# Patient Record
Sex: Male | Born: 1970 | Race: White | Hispanic: No | Marital: Single | Smoking: Current some day smoker
Health system: Southern US, Community
[De-identification: ages and names within clinical notes are randomized; demographics above are authoritative.]

## PROBLEM LIST (undated history)

## (undated) DIAGNOSIS — I10 Essential (primary) hypertension: Secondary | ICD-10-CM

## (undated) DIAGNOSIS — E119 Type 2 diabetes mellitus without complications: Secondary | ICD-10-CM

## (undated) DIAGNOSIS — E785 Hyperlipidemia, unspecified: Secondary | ICD-10-CM

## (undated) DIAGNOSIS — K5792 Diverticulitis of intestine, part unspecified, without perforation or abscess without bleeding: Secondary | ICD-10-CM

## (undated) HISTORY — DX: Type 2 diabetes mellitus without complications: E11.9

## (undated) HISTORY — DX: Essential (primary) hypertension: I10

## (undated) HISTORY — DX: Hyperlipidemia, unspecified: E78.5

---

## 2012-05-11 ENCOUNTER — Ambulatory Visit: Payer: Self-pay

## 2015-08-06 ENCOUNTER — Encounter: Payer: Self-pay | Admitting: Emergency Medicine

## 2015-08-06 ENCOUNTER — Emergency Department
Admission: EM | Admit: 2015-08-06 | Discharge: 2015-08-06 | Disposition: A | Discharge status: Home / Self Care | Payer: BLUE CROSS/BLUE SHIELD | Attending: Emergency Medicine | Admitting: Emergency Medicine

## 2015-08-06 DIAGNOSIS — W228XXA Striking against or struck by other objects, initial encounter: Secondary | ICD-10-CM | POA: Diagnosis not present

## 2015-08-06 DIAGNOSIS — Y929 Unspecified place or not applicable: Secondary | ICD-10-CM | POA: Insufficient documentation

## 2015-08-06 DIAGNOSIS — Y999 Unspecified external cause status: Secondary | ICD-10-CM | POA: Diagnosis not present

## 2015-08-06 DIAGNOSIS — F17210 Nicotine dependence, cigarettes, uncomplicated: Secondary | ICD-10-CM | POA: Insufficient documentation

## 2015-08-06 DIAGNOSIS — S01111A Laceration without foreign body of right eyelid and periocular area, initial encounter: Secondary | ICD-10-CM | POA: Insufficient documentation

## 2015-08-06 DIAGNOSIS — Y9389 Activity, other specified: Secondary | ICD-10-CM | POA: Insufficient documentation

## 2015-08-06 DIAGNOSIS — S0181XA Laceration without foreign body of other part of head, initial encounter: Secondary | ICD-10-CM

## 2015-08-06 NOTE — ED Triage Notes (Signed)
Patient presents to the ED with laceration to the right side of his forehead.  Patient states he was doing work on his truck and accidentally hit himself in the head with a ratchet.  Patient is in no obvious distress at this time.  Patient denies losing consciousness.  Patient alert and oriented x 4 at this time and ambulatory to triage.

## 2015-08-06 NOTE — ED Notes (Signed)
See triage note. No LOC, pain 8/10. Pt alert & oriented. NAD noted.

## 2015-08-06 NOTE — ED Provider Notes (Signed)
[facility] Emergency Department Provider Note   ____________________________________________  Time seen: Approximately 6:58 PM  I have reviewed the triage vital signs and the nursing notes.   HISTORY  Chief Complaint Laceration    HPI Jeremiah Neal is a 45 y.o. male patient complaining of right eyebrow laceration secondary to blunt trauma.Self-inflicted wound secondary to a ratchet admitting the face. Patient denies any vision loss. Patient denies any vertigo or headache. He was controlled direct pressure. No other palliative measures for his complaint. Patient rates pain as a 3/10. Patient describes pain as a dull ache.  History reviewed. No pertinent past medical history.  There are no active problems to display for this patient.   History reviewed. No pertinent surgical history.    Allergies Penicillins  No family history on file.  Social History Social History  Substance Use Topics  . Smoking status: Current Every Day Smoker    Packs/day: 1.00    Types: Cigarettes  . Smokeless tobacco: Never Used  . Alcohol use Yes     Comment: occasionally    Review of Systems Constitutional: No fever/chills Eyes: No visual changes. ENT: No sore throat. Cardiovascular: Denies chest pain. Respiratory: Denies shortness of breath. Gastrointestinal: No abdominal pain.  No nausea, no vomiting.  No diarrhea.  No constipation. Genitourinary: Negative for dysuria. Musculoskeletal: Negative for back pain. Skin: Negative for rash.Right eyebrow laceration Neurological: Negative for headaches, focal weakness or numbness.    ____________________________________________   PHYSICAL EXAM:  VITAL SIGNS: ED Triage Vitals  Enc Vitals Group     BP 08/06/15 1836 (!) 171/109     Pulse Rate 08/06/15 1836 96     Resp 08/06/15 1836 20     Temp 08/06/15 1836 98.1 F (36.7 C)     Temp src --      SpO2 08/06/15 1836 96 %     Weight 08/06/15 1832 210 lb  (95.3 kg)     Height 08/06/15 1832  (1.778 m)     Head Circumference --      Peak Flow --      Pain Score 08/06/15 1833 8     Pain Loc --      Pain Edu? --      Excl. in GC? --     Constitutional: Alert and oriented. Well appearing and in no acute distress. Eyes: Conjunctivae are normal. PERRL. EOMI. Head: Atraumatic. Nose: No congestion/rhinnorhea. Mouth/Throat: Mucous membranes are moist.  Oropharynx non-erythematous. Neck: No stridor. No cervical spine tenderness to palpation. Hematological/Lymphatic/Immunilogical: No cervical lymphadenopathy. Cardiovascular: Normal rate, regular rhythm. Grossly normal heart sounds.  Good peripheral circulation.Elevated blood pressure. Will retake before discharge. Respiratory: Normal respiratory effort.  No retractions. Lungs CTAB. Gastrointestinal: Soft and nontender. No distention. No abdominal bruits. No CVA tenderness. Musculoskeletal: No lower extremity tenderness nor edema.  No joint effusions. Neurologic:  Normal speech and language. No gross focal neurologic deficits are appreciated. No gait instability. Skin:  Skin is warm, dry and intact. No rash noted. Psychiatric: Mood and affect are normal. Speech and behavior are normal.  ____________________________________________   LABS (all labs ordered are listed, but only abnormal results are displayed)  Labs Reviewed - No data to display ____________________________________________  EKG   ____________________________________________  RADIOLOGY  ____________________________________________   PROCEDURES  Procedure(s) performed: None  Procedures  Critical Care performed: No  ____________________________________________   INITIAL IMPRESSION / ASSESSMENT AND PLAN / ED COURSE  Pertinent labs & imaging results that were available during  my care of the patient were reviewed by me and considered in my medical decision making (see chart for details).  LACERATION  REPAIR Performed by: Joni Reining Authorized by: Joni Reining Consent: Verbal consent obtained. Risks and benefits: risks, benefits and alternatives were discussed Consent given by: patient Patient identity confirmed: provided demographic data Prepped and Draped in normal sterile fashion Wound explored  Laceration Location: Right eyebrow  Laceration Length: 1 cm  No Foreign Bodies seen or palpated Irrigation method: syringe Amount of cleaning: standard Skin closure: Dermabond  Patient tolerance: Patient tolerated the procedure well with no immediate complications.   Clinical Course     ____________________________________________   FINAL CLINICAL IMPRESSION(S) / ED DIAGNOSES Facial laceration Patient given discharge care instructions. Advised to return if wound reopens. Discharge blood pressure was 156/97. Final diagnoses:  Facial laceration, initial encounter      NEW MEDICATIONS STARTED DURING THIS VISIT:  New Prescriptions   No medications on file     Note:  This document was prepared using Dragon voice recognition software and may include unintentional dictation errors.    Joni Reining, PA-C 08/06/15 1906    Joni Reining, PA-C 08/06/15 1910    Joni Reining, PA-C 08/06/15 1916    Rockne Menghini, MD 08/07/15 [phone]

## 2017-06-24 ENCOUNTER — Emergency Department
Admission: EM | Admit: 2017-06-24 | Discharge: 2017-06-24 | Disposition: A | Discharge status: Home / Self Care | Payer: BLUE CROSS/BLUE SHIELD | Attending: Emergency Medicine | Admitting: Emergency Medicine

## 2017-06-24 ENCOUNTER — Encounter: Payer: Self-pay | Admitting: Emergency Medicine

## 2017-06-24 ENCOUNTER — Emergency Department: Payer: BLUE CROSS/BLUE SHIELD

## 2017-06-24 DIAGNOSIS — K5792 Diverticulitis of intestine, part unspecified, without perforation or abscess without bleeding: Secondary | ICD-10-CM

## 2017-06-24 DIAGNOSIS — N433 Hydrocele, unspecified: Secondary | ICD-10-CM | POA: Diagnosis not present

## 2017-06-24 DIAGNOSIS — F17210 Nicotine dependence, cigarettes, uncomplicated: Secondary | ICD-10-CM | POA: Diagnosis not present

## 2017-06-24 DIAGNOSIS — R109 Unspecified abdominal pain: Secondary | ICD-10-CM | POA: Diagnosis present

## 2017-06-24 DIAGNOSIS — K5732 Diverticulitis of large intestine without perforation or abscess without bleeding: Secondary | ICD-10-CM | POA: Diagnosis not present

## 2017-06-24 LAB — CBC
HCT: 44.9 % (ref 40.0–52.0)
HEMOGLOBIN: 15.6 g/dL (ref 13.0–18.0)
MCH: 30.1 pg (ref 26.0–34.0)
MCHC: 34.7 g/dL (ref 32.0–36.0)
MCV: 86.8 fL (ref 80.0–100.0)
Platelets: 304 10*3/uL (ref 150–440)
RBC: 5.17 MIL/uL (ref 4.40–5.90)
RDW: 13.3 % (ref 11.5–14.5)
WBC: 11.7 10*3/uL — ABNORMAL HIGH (ref 3.8–10.6)

## 2017-06-24 LAB — COMPREHENSIVE METABOLIC PANEL
ALBUMIN: 4.1 g/dL (ref 3.5–5.0)
ALT: 22 U/L (ref 17–63)
ANION GAP: 9 (ref 5–15)
AST: 19 U/L (ref 15–41)
Alkaline Phosphatase: 74 U/L (ref 38–126)
BUN: 13 mg/dL (ref 6–20)
CHLORIDE: 105 mmol/L (ref 101–111)
CO2: 25 mmol/L (ref 22–32)
Calcium: 9.1 mg/dL (ref 8.9–10.3)
Creatinine, Ser: 1.21 mg/dL (ref 0.61–1.24)
GFR calc Af Amer: >60 mL/min (ref >60)
GFR calc non Af Amer: >60 mL/min (ref >60)
GLUCOSE: 123 mg/dL — AB (ref 65–99)
Potassium: 3.9 mmol/L (ref 3.5–5.1)
SODIUM: 139 mmol/L (ref 135–145)
TOTAL PROTEIN: 7.6 g/dL (ref 6.5–8.1)
Total Bilirubin: 0.6 mg/dL (ref 0.3–1.2)

## 2017-06-24 LAB — URINALYSIS, COMPLETE (UACMP) WITH MICROSCOPIC
BILIRUBIN URINE: NEGATIVE
Bacteria, UA: NONE SEEN
Glucose, UA: NEGATIVE mg/dL
HGB URINE DIPSTICK: NEGATIVE
Ketones, ur: NEGATIVE mg/dL
Leukocytes, UA: NEGATIVE
NITRITE: NEGATIVE
PROTEIN: NEGATIVE mg/dL
Specific Gravity, Urine: 1.033 — ABNORMAL HIGH (ref 1.005–1.030)
Squamous Epithelial / LPF: NONE SEEN (ref 0–5)
pH: 6 (ref 5.0–8.0)

## 2017-06-24 LAB — LIPASE, BLOOD: LIPASE: 23 U/L (ref 11–51)

## 2017-06-24 MED ORDER — CIPROFLOXACIN HCL 500 MG PO TABS
500.0000 mg | ORAL_TABLET | Freq: Once | ORAL | Status: AC
  Administered 2017-06-24: 500 mg via ORAL
  Filled 2017-06-24: qty 1

## 2017-06-24 MED ORDER — SODIUM CHLORIDE 0.9 % IV BOLUS
1000.0000 mL | Freq: Once | INTRAVENOUS | Status: AC
  Administered 2017-06-24: 1000 mL via INTRAVENOUS

## 2017-06-24 MED ORDER — ONDANSETRON HCL 4 MG/2ML IJ SOLN
4.0000 mg | Freq: Once | INTRAMUSCULAR | Status: AC
  Administered 2017-06-24: 4 mg via INTRAVENOUS
  Filled 2017-06-24: qty 2

## 2017-06-24 MED ORDER — MORPHINE SULFATE (PF) 4 MG/ML IV SOLN
4.0000 mg | Freq: Once | INTRAVENOUS | Status: AC
  Administered 2017-06-24: 4 mg via INTRAVENOUS
  Filled 2017-06-24: qty 1

## 2017-06-24 MED ORDER — CIPROFLOXACIN HCL 500 MG PO TABS: 500.0000 mg | ORAL_TABLET | Freq: Two times a day (BID) | ORAL | 0 refills | Status: AC

## 2017-06-24 MED ORDER — METRONIDAZOLE 500 MG PO TABS
500.0000 mg | ORAL_TABLET | Freq: Once | ORAL | Status: AC
  Administered 2017-06-24: 500 mg via ORAL
  Filled 2017-06-24: qty 1

## 2017-06-24 MED ORDER — IOHEXOL 300 MG/ML  SOLN
100.0000 mL | Freq: Once | INTRAMUSCULAR | Status: AC | PRN
  Administered 2017-06-24: 100 mL via INTRAVENOUS
  Filled 2017-06-24: qty 100

## 2017-06-24 MED ORDER — METRONIDAZOLE 500 MG PO TABS: 500.0000 mg | ORAL_TABLET | Freq: Three times a day (TID) | ORAL | 0 refills | Status: AC

## 2017-06-24 MED ORDER — TRAMADOL HCL 50 MG PO TABS: 50.0000 mg | ORAL_TABLET | Freq: Four times a day (QID) | ORAL | 0 refills | Status: DC | PRN

## 2017-06-24 NOTE — ED Provider Notes (Signed)
[facility] Emergency Department Provider Note ____________________________________________   First MD Initiated Contact with Patient 06/24/17 1504     (approximate)  I have reviewed the triage vital signs and the nursing notes.  HISTORY  Chief Complaint Abdominal Pain  HPI Jeremiah Jeremiah is a 47 y.o. male with a history of an inguinal hernia who is presenting to the emergency department with 1 week of lower abdominal pain.  He says that he was moving a heavy piece of equipment about a week ago and then aching started the day after.  However, he is now having episodes of severe pain to the lower abdomen which are intermittent.  He denies any nausea, vomiting or diarrhea or difficulty with eating.  Says that his inguinal hernia which is causing scrotal swelling is stable and unchanged in size and has been ongoing for years.  Denies any abdominal surgery.   History reviewed. No pertinent past medical history.  There are no active problems to display for this patient.   History reviewed. No pertinent surgical history.  Prior to Admission medications   Not on File    Allergies Penicillins  No family history on file.  Social History Social History   Tobacco Use  . Smoking status: Current Every Day Smoker    Packs/day: 1.00    Types: Cigarettes  . Smokeless tobacco: Never Used  Substance Use Topics  . Alcohol use: Yes    Comment: occasionally  . Drug use: Not on file    Review of Systems  Constitutional: No fever/chills Eyes: No visual changes. ENT: No sore throat. Cardiovascular: Denies chest pain. Respiratory: Denies shortness of breath. Gastrointestinal:   No nausea, no vomiting.  No diarrhea.  No constipation. Genitourinary: As above Musculoskeletal: Negative for back pain. Skin: Negative for rash. Neurological: Negative for headaches, focal weakness or numbness.   ____________________________________________   PHYSICAL  EXAM:  VITAL SIGNS: ED Triage Vitals  Enc Vitals Group     BP 06/24/17 1331 (!) 162/96     Pulse Rate 06/24/17 1331 100     Resp --      Temp 06/24/17 1331 98.7 F (37.1 C)     Temp Source 06/24/17 1331 Oral     SpO2 06/24/17 1331 98 %     Weight 06/24/17 1330 235 lb (106.6 kg)     Height 06/24/17 1330 5\' 10"  (1.778 m)     Head Circumference --      Peak Flow --      Pain Score 06/24/17 1351 6     Pain Loc --      Pain Edu? --      Excl. in GC? --     Constitutional: Alert and oriented.  I entered the room the patient is holding onto a countertop and pain, kneeling down. Eyes: Conjunctivae are normal.  Head: Atraumatic. Nose: No congestion/rhinnorhea. Mouth/Throat: Mucous membranes are moist.  Neck: No stridor.   Cardiovascular: Normal rate, regular rhythm. Grossly normal heart sounds.  Respiratory: Normal respiratory effort.  No retractions. Lungs CTAB. Gastrointestinal: Mildly distended abdomen with mild to moderate tenderness palpation to the lower abdomen.  No rebound or guarding.   Genitourinary: Scrotum, bilaterally, is severely swollen but soft.  No tenderness to palpation. Musculoskeletal: No lower extremity tenderness nor edema.  No joint effusions. Neurologic:  Normal speech and language. No gross focal neurologic deficits are appreciated. Skin:  Skin is warm, dry and intact. No rash noted. Psychiatric: Mood and affect are normal.  Speech and behavior are normal.  ____________________________________________   LABS (all labs ordered are listed, but only abnormal results are displayed)  Labs Reviewed  COMPREHENSIVE METABOLIC PANEL - Abnormal; Notable for the following components:      Result Value   Glucose, Bld 123 (*)    All other components within normal limits  CBC - Abnormal; Notable for the following components:   WBC 11.7 (*)    All other components within normal limits  LIPASE, BLOOD  URINALYSIS, COMPLETE (UACMP) WITH MICROSCOPIC    ____________________________________________  EKG   ____________________________________________  RADIOLOGY  Sigmoid diverticulitis without any collection or abscess.  No free air.  Large right hydrocele. ____________________________________________   PROCEDURES  Procedure(s) performed:   Procedures  Critical Care performed:   ____________________________________________   INITIAL IMPRESSION / ASSESSMENT AND PLAN / ED COURSE  Pertinent labs & imaging results that were available during my care of the patient were reviewed by me and considered in my medical decision making (see chart for details).  Differential diagnosis includes, but is not limited to, acute appendicitis, renal colic, testicular torsion, urinary tract infection/pyelonephritis, prostatitis,  epididymitis, diverticulitis, small bowel obstruction or ileus, colitis, abdominal aortic aneurysm, gastroenteritis, hernia, etc. As part of my medical decision making, I reviewed the following data within the electronic MEDICAL RECORD NUMBER Notes from prior ED visits  ----------------------------------------- 4:55 PM on 06/24/2017 -----------------------------------------  Patient feeling much improved after pain medication.  Updated about CT findings.  Will be discharged on Cipro, Flagyl as well as tramadol.  He will follow-up with urology as well as primary care.  We also discussed dietary changes including more fiber and less meat and potatoes.  He is understanding of the diagnosis as well as the treatment plan willing to comply.  Patient nontoxic in appearance.  He was given return precautions including to come back to the emergency department for any worsening or concerning symptoms, especially fever, worsening pain, nausea, vomiting or diarrhea.  He is understanding of this plan willing to comply. ____________________________________________   FINAL CLINICAL IMPRESSION(S) / ED DIAGNOSES  Diverticulitis.   Hydrocele.    NEW MEDICATIONS STARTED DURING THIS VISIT:  New Prescriptions   No medications on file     Note:  This document was prepared using Dragon voice recognition software and may include unintentional dictation errors.     Myrna Blazer, MD 06/24/17 [phone]

## 2017-06-24 NOTE — ED Notes (Signed)
Patient transported to CT 

## 2017-06-24 NOTE — ED Triage Notes (Signed)
Pt arrived with complaints of lower abdominal pain that started last Thursday and has increased in severity. Pt states the pain started after moving heavy object. Pt states pain is intermittent.

## 2017-07-06 ENCOUNTER — Inpatient Hospital Stay
Admission: EM | Admit: 2017-07-06 | Discharge: 2017-07-09 | DRG: 392 | Disposition: A | Discharge status: Home / Self Care | Payer: Medicaid Other | Attending: Surgery | Admitting: Surgery

## 2017-07-06 ENCOUNTER — Encounter: Payer: Self-pay | Admitting: Emergency Medicine

## 2017-07-06 ENCOUNTER — Emergency Department: Payer: Medicaid Other

## 2017-07-06 ENCOUNTER — Other Ambulatory Visit: Payer: Self-pay

## 2017-07-06 DIAGNOSIS — K5792 Diverticulitis of intestine, part unspecified, without perforation or abscess without bleeding: Secondary | ICD-10-CM | POA: Diagnosis not present

## 2017-07-06 DIAGNOSIS — K5732 Diverticulitis of large intestine without perforation or abscess without bleeding: Secondary | ICD-10-CM | POA: Diagnosis not present

## 2017-07-06 DIAGNOSIS — Z880 Allergy status to penicillin: Secondary | ICD-10-CM | POA: Diagnosis not present

## 2017-07-06 DIAGNOSIS — R1084 Generalized abdominal pain: Secondary | ICD-10-CM

## 2017-07-06 DIAGNOSIS — F17210 Nicotine dependence, cigarettes, uncomplicated: Secondary | ICD-10-CM | POA: Diagnosis present

## 2017-07-06 LAB — CBC
HCT: 47.3 % (ref 40.0–52.0)
Hemoglobin: 15.8 g/dL (ref 13.0–18.0)
MCH: 28.5 pg (ref 26.0–34.0)
MCHC: 33.4 g/dL (ref 32.0–36.0)
MCV: 85.4 fL (ref 80.0–100.0)
PLATELETS: 331 10*3/uL (ref 150–440)
RBC: 5.54 MIL/uL (ref 4.40–5.90)
RDW: 13.3 % (ref 11.5–14.5)
WBC: 15.5 10*3/uL — AB (ref 3.8–10.6)

## 2017-07-06 LAB — COMPREHENSIVE METABOLIC PANEL
ALT: 28 U/L (ref 17–63)
AST: 20 U/L (ref 15–41)
Albumin: 4 g/dL (ref 3.5–5.0)
Alkaline Phosphatase: 68 U/L (ref 38–126)
Anion gap: 11 (ref 5–15)
BILIRUBIN TOTAL: 0.4 mg/dL (ref 0.3–1.2)
BUN: 15 mg/dL (ref 6–20)
CO2: 22 mmol/L (ref 22–32)
CREATININE: 1.09 mg/dL (ref 0.61–1.24)
Calcium: 9.2 mg/dL (ref 8.9–10.3)
Chloride: 102 mmol/L (ref 101–111)
GFR calc Af Amer: >60 mL/min (ref >60)
GLUCOSE: 95 mg/dL (ref 65–99)
Potassium: 3.9 mmol/L (ref 3.5–5.1)
SODIUM: 135 mmol/L (ref 135–145)
TOTAL PROTEIN: 7.4 g/dL (ref 6.5–8.1)

## 2017-07-06 LAB — LIPASE, BLOOD: LIPASE: 26 U/L (ref 11–51)

## 2017-07-06 MED ORDER — ONDANSETRON HCL 4 MG/2ML IJ SOLN: 4.0000 mg | Freq: Four times a day (QID) | INTRAMUSCULAR | Status: DC | PRN

## 2017-07-06 MED ORDER — PIPERACILLIN-TAZOBACTAM 3.375 G IVPB 30 MIN
3.3750 g | Freq: Once | INTRAVENOUS | Status: AC
  Administered 2017-07-06: 3.375 g via INTRAVENOUS
  Filled 2017-07-06: qty 50

## 2017-07-06 MED ORDER — MORPHINE SULFATE (PF) 4 MG/ML IV SOLN
4.0000 mg | Freq: Once | INTRAVENOUS | Status: AC
  Administered 2017-07-06: 4 mg via INTRAVENOUS
  Filled 2017-07-06: qty 1

## 2017-07-06 MED ORDER — ONDANSETRON HCL 4 MG/2ML IJ SOLN
4.0000 mg | Freq: Once | INTRAMUSCULAR | Status: AC
  Administered 2017-07-06: 4 mg via INTRAVENOUS
  Filled 2017-07-06: qty 2

## 2017-07-06 MED ORDER — FAMOTIDINE IN NACL 20-0.9 MG/50ML-% IV SOLN
20.0000 mg | Freq: Two times a day (BID) | INTRAVENOUS | Status: DC
  Administered 2017-07-06: 20 mg via INTRAVENOUS
  Filled 2017-07-06: qty 50

## 2017-07-06 MED ORDER — LACTATED RINGERS IV SOLN
125.0000 mL/h | INTRAVENOUS | Status: DC
  Administered 2017-07-06 – 2017-07-08 (×5): 125 mL/h via INTRAVENOUS

## 2017-07-06 MED ORDER — SODIUM CHLORIDE 0.9 % IV BOLUS
1000.0000 mL | Freq: Once | INTRAVENOUS | Status: AC
Start: 2017-07-06 — End: 2017-07-06
  Administered 2017-07-06: 1000 mL via INTRAVENOUS

## 2017-07-06 MED ORDER — PIPERACILLIN-TAZOBACTAM 3.375 G IVPB
3.3750 g | Freq: Three times a day (TID) | INTRAVENOUS | Status: DC
  Administered 2017-07-06 – 2017-07-09 (×8): 3.375 g via INTRAVENOUS
  Filled 2017-07-06 (×8): qty 50

## 2017-07-06 MED ORDER — ONDANSETRON 4 MG PO TBDP: 4.0000 mg | ORAL_TABLET | Freq: Four times a day (QID) | ORAL | Status: DC | PRN

## 2017-07-06 MED ORDER — KETOROLAC TROMETHAMINE 30 MG/ML IJ SOLN
30.0000 mg | Freq: Four times a day (QID) | INTRAMUSCULAR | Status: DC
  Administered 2017-07-06 – 2017-07-09 (×10): 30 mg via INTRAVENOUS
  Filled 2017-07-06 (×10): qty 1

## 2017-07-06 MED ORDER — HYDROMORPHONE HCL 1 MG/ML IJ SOLN
0.5000 mg | INTRAMUSCULAR | Status: DC | PRN
  Administered 2017-07-07 – 2017-07-08 (×2): 0.5 mg via INTRAVENOUS
  Filled 2017-07-06 (×2): qty 0.5

## 2017-07-06 MED ORDER — ENOXAPARIN SODIUM 40 MG/0.4ML ~~LOC~~ SOLN
40.0000 mg | SUBCUTANEOUS | Status: DC
  Filled 2017-07-06: qty 0.4

## 2017-07-06 MED ORDER — FENTANYL CITRATE (PF) 100 MCG/2ML IJ SOLN
50.0000 ug | Freq: Once | INTRAMUSCULAR | Status: AC
  Administered 2017-07-06: 50 ug via INTRAVENOUS
  Filled 2017-07-06: qty 2

## 2017-07-06 MED ORDER — IOPAMIDOL (ISOVUE-300) INJECTION 61%
100.0000 mL | Freq: Once | INTRAVENOUS | Status: AC | PRN
  Administered 2017-07-06: 100 mL via INTRAVENOUS

## 2017-07-06 MED ORDER — NICOTINE 21 MG/24HR TD PT24
21.0000 mg | MEDICATED_PATCH | Freq: Once | TRANSDERMAL | Status: AC
  Administered 2017-07-06: 21 mg via TRANSDERMAL
  Filled 2017-07-06: qty 1

## 2017-07-06 MED ORDER — HYDRALAZINE HCL 20 MG/ML IJ SOLN: 10.0000 mg | Freq: Four times a day (QID) | INTRAMUSCULAR | Status: DC | PRN

## 2017-07-06 NOTE — ED Triage Notes (Signed)
Pt to ED via POV with c/.o lower abd pain. Pt recnetly dx with diverticulitis x2wks ago. Pt states same symptoms. Denies fevers. Completed course of antibiotics at home.

## 2017-07-06 NOTE — ED Provider Notes (Signed)
[facility] Emergency Department Provider Note  ____________________________________________   I have reviewed the triage vital signs and the nursing notes.   HISTORY  Chief Complaint Abdominal Pain   History limited by: Not Limited   HPI Jeremiah Neal is a 47 y.o. male who presents to the emergency department today because of concern for abdominal pain. It is locate din the lower and left lower quadrant. The patient states that the pain came back yesterday. He did have similar pain and was seen in the emergency department a couple of weeks ago. He was found to have diverticulitis and was treated with antibiotics. The pain did improve until it restarted. He states that it does come and go in waves. It is as severe as it was last time but not worse. He has not noticed any significant change in defecation. Denies any change in urination. Denies any fevers or nausea and vomiting.    Per medical record review patient has a history of recent emergency department visit and diagnosis of diverticulitis by CT scan.  History reviewed. No pertinent past medical history.  There are no active problems to display for this patient.   History reviewed. No pertinent surgical history.  Prior to Admission medications   Medication Sig Start Date End Date Taking? Authorizing Provider  traMADol (ULTRAM) 50 MG tablet Take 1 tablet (50 mg total) by mouth every 6 (six) hours as needed. 06/24/17 06/24/18  Myrna BlazerSchaevitz, Mohamud Matthew, MD    Allergies Penicillins  No family history on file.  Social History Social History   Tobacco Use  . Smoking status: Current Every Day Smoker    Packs/day: 1.00    Types: Cigarettes  . Smokeless tobacco: Never Used  Substance Use Topics  . Alcohol use: Yes    Comment: occasionally  . Drug use: Not Currently    Review of Systems Constitutional: No fever/chills Eyes: No visual changes. ENT: No sore throat. Cardiovascular: Denies chest  pain. Respiratory: Denies shortness of breath. Gastrointestinal: Positive for abdominal pain. Genitourinary: Negative for dysuria. Musculoskeletal: Negative for back pain. Skin: Negative for rash. Neurological: Negative for headaches, focal weakness or numbness.  ____________________________________________   PHYSICAL EXAM:  VITAL SIGNS: ED Triage Vitals  Enc Vitals Group     BP 07/06/17 1556 (!) 158/117     Pulse Rate 07/06/17 1556 (!) 107     Resp 07/06/17 1556 20     Temp 07/06/17 1556 97.7 F (36.5 C)     Temp Source 07/06/17 1556 Oral     SpO2 07/06/17 1556 96 %     Weight 07/06/17 1557 235 lb (106.6 kg)     Height 07/06/17 1557 5\' 10"  (1.778 m)     Head Circumference --      Peak Flow --      Pain Score 07/06/17 1601 7   Constitutional: Alert and oriented.  Eyes: Conjunctivae are normal.  ENT      Head: Normocephalic and atraumatic.      Nose: No congestion/rhinnorhea.      Mouth/Throat: Mucous membranes are moist.      Neck: No stridor. Hematological/Lymphatic/Immunilogical: No cervical lymphadenopathy. Cardiovascular: Normal rate, regular rhythm.  No murmurs, rubs, or gallops. Respiratory: Normal respiratory effort without tachypnea nor retractions. Breath sounds are clear and equal bilaterally. No wheezes/rales/rhonchi. Gastrointestinal: Soft and tender to palpation in the suprapubic and left lower quadrant. No rebound. No guarding.  Genitourinary: Deferred Musculoskeletal: Normal range of motion in all extremities. No lower extremity edema.  Neurologic:  Normal speech and language. No gross focal neurologic deficits are appreciated.  Skin:  Skin is warm, dry and intact. No rash noted. Psychiatric: Mood and affect are normal. Speech and behavior are normal. Patient exhibits appropriate insight and judgment.  ____________________________________________    LABS (pertinent positives/negatives)  CBC wbc 15.5, hgb 15.8, plt 331 Lipase 26 CMP  wnl ____________________________________________   EKG  None  ____________________________________________    RADIOLOGY  CT abd/pel Worsening diverticulitis  ____________________________________________   PROCEDURES  Procedures  ____________________________________________   INITIAL IMPRESSION / ASSESSMENT AND PLAN / ED COURSE  Pertinent labs & imaging results that were available during my care of the patient were reviewed by me and considered in my medical decision making (see chart for details).   Patient presented to the emergency department today because of concern for return of abdominal pain. Patient was recently treated for diverticulitis. Patient's blood shows slighlty increased leukocytosis. CT scan shows slightly worsening diverticulitis. Will plan on admission for iv antibiotics.    ____________________________________________   FINAL CLINICAL IMPRESSION(S) / ED DIAGNOSES  Final diagnoses:  Generalized abdominal pain  Diverticulitis     Note: This dictation was prepared with Dragon dictation. Any transcriptional errors that result from this process are unintentional     Phineas Semen, MD 07/06/17 2000

## 2017-07-06 NOTE — ED Notes (Signed)
Pt instructed to stay in bed due to pain medication potentially making dizzy. Pt stated that if pain comes back he "has to stand up and walk around." MD aware.

## 2017-07-06 NOTE — Consult Note (Signed)
Pharmacy Antibiotic Note  Jeremiah Neal is a 47 y.o. male admitted on 07/06/2017 with Intra-Abdominal Infection. Pharmacy has been consulted for Zosyn dosing.  Plan: Start Zosyn 3.375 IV EI every 8 hours.   Height: 5\' 10"  (177.8 cm) Weight: 235 lb (106.6 kg) IBW/kg (Calculated) : 73  Temp (24hrs), Avg:97.7 F (36.5 C), Min:97.7 F (36.5 C), Max:97.7 F (36.5 C)  Recent Labs  Lab 07/06/17 1605  WBC 15.5*  CREATININE 1.09    Estimated Creatinine Clearance: 101.3 mL/min (by C-G formula based on SCr of 1.09 mg/dL).    Allergies  Allergen Reactions  . Penicillins Nausea Only    Antimicrobials this admission: 6/24 Zosyn >>  Thank you for allowing pharmacy to be a part of this patient's care.  Gardner CandleSheema M Linda Neal, PharmD, BCPS Clinical Pharmacist 07/06/2017 9:34 PM

## 2017-07-06 NOTE — ED Notes (Signed)
Pt pacing in room. Steady gait noted.

## 2017-07-06 NOTE — H&P (Addendum)
Date of Admission:  07/06/2017  Reason for Admission:  Acute diverticulitis  History of Present Illness: Jeremiah Neal is a 47 y.o. male who was seen in the ED on 6/12 with a week history of abdominal pain and was diagnosed with acute diverticulitis of the sigmoid colon.  His WBC was only mildly elevated to 11.7 and felt better after some pain medication and was discharged to home with oral Cipro and Flagyl.  He reports that the pain did indeed get better while on antibiotics and he completed the course.  However, after the course was completed, the pain started coming back again and now he reports that it's as severe as it was before.  It is not worse however.  He denies any fevers or chills, chest pain or shortness of breath, nausea or vomiting, or blood in the stool.  Has been having regular bowel movements.  Has been able to tolerate a diet without it worsening his pain, but today with the pain he has not eaten.  Past Medical History: --Acute diverticulitis   Past Surgical History: --None  Home Medications: Prior to Admission medications   Medication --None Sig Start Date End Date Taking? Authorizing Provider    Allergies: Allergies  Allergen Reactions  . Penicillins Nausea Only    Social History:  reports that he has been smoking cigarettes.  He has been smoking about 1.00 pack per day. He has never used smokeless tobacco. He reports that he drinks alcohol. He reports that he has current or past drug history.   Family History: No history of diverticulitis  Review of Systems: Review of Systems  Constitutional: Negative for chills and fever.  HENT: Negative for hearing loss.   Respiratory: Negative for shortness of breath.   Cardiovascular: Negative for chest pain.  Gastrointestinal: Positive for abdominal pain. Negative for blood in stool, constipation, diarrhea, nausea and vomiting.  Genitourinary: Negative for dysuria.  Musculoskeletal: Negative for myalgias.  Skin:  Negative for rash.  Neurological: Negative for dizziness.  Psychiatric/Behavioral: Negative for depression.  All other systems reviewed and are negative.   Physical Exam BP 134/86   Pulse (!) 102   Temp 97.7 F (36.5 C) (Oral)   Resp 18   Ht 5\' 10"  (1.778 m)   Wt 235 lb (106.6 kg)   SpO2 93%   BMI 33.72 kg/m  CONSTITUTIONAL: No acute distress HEENT:  Normocephalic, atraumatic, extraocular motion intact. NECK: Trachea is midline, and there is no jugular venous distension.  RESPIRATORY:  Lungs are clear, and breath sounds are equal bilaterally. Normal respiratory effort without pathologic use of accessory muscles. CARDIOVASCULAR: Heart is regular without murmurs, gallops, or rubs. GI: The abdomen is soft, obese, non-distended, with tenderness to palpation over the low abdomen in the pelvis. Mild discomfort in the left lower quadrant. MUSCULOSKELETAL:  Normal muscle strength and tone in all four extremities.  No peripheral edema or cyanosis. SKIN: Skin turgor is normal. There are no pathologic skin lesions.  NEUROLOGIC:  Motor and sensation is grossly normal.  Cranial nerves are grossly intact. PSYCH:  Alert and oriented to person, place and time. Affect is normal.  Laboratory Analysis: Results for orders placed or performed during the hospital encounter of 07/06/17 (from the past 24 hour(s))  Lipase, blood     Status: None   Collection Time: 07/06/17  4:05 PM  Result Value Ref Range   Lipase 26 11 - 51 U/L  Comprehensive metabolic panel     Status: None   Collection  Time: 07/06/17  4:05 PM  Result Value Ref Range   Sodium 135 135 - 145 mmol/L   Potassium 3.9 3.5 - 5.1 mmol/L   Chloride 102 101 - 111 mmol/L   CO2 22 22 - 32 mmol/L   Glucose, Bld 95 65 - 99 mg/dL   BUN 15 6 - 20 mg/dL   Creatinine, Ser 4.09 0.61 - 1.24 mg/dL   Calcium 9.2 8.9 - 81.1 mg/dL   Total Protein 7.4 6.5 - 8.1 g/dL   Albumin 4.0 3.5 - 5.0 g/dL   AST 20 15 - 41 U/L   ALT 28 17 - 63 U/L   Alkaline  Phosphatase 68 38 - 126 U/L   Total Bilirubin 0.4 0.3 - 1.2 mg/dL   GFR calc non Af Amer >60 >60 mL/min   GFR calc Af Amer >60 >60 mL/min   Anion gap 11 5 - 15  CBC     Status: Abnormal   Collection Time: 07/06/17  4:05 PM  Result Value Ref Range   WBC 15.5 (H) 3.8 - 10.6 K/uL   RBC 5.54 4.40 - 5.90 MIL/uL   Hemoglobin 15.8 13.0 - 18.0 g/dL   HCT 91.4 78.2 - 95.6 %   MCV 85.4 80.0 - 100.0 fL   MCH 28.5 26.0 - 34.0 pg   MCHC 33.4 32.0 - 36.0 g/dL   RDW 21.3 08.6 - 57.8 %   Platelets 331 150 - 440 K/uL    Imaging: Ct Abdomen Pelvis W Contrast  Result Date: 07/06/2017 CLINICAL DATA:  Diverticulitis diagnosed 2 weeks ago, patient returns with same symptoms. EXAM: CT ABDOMEN AND PELVIS WITH CONTRAST TECHNIQUE: Multidetector CT imaging of the abdomen and pelvis was performed using the standard protocol following bolus administration of intravenous contrast. CONTRAST:  ISOVUE-300 IOPAMIDOL (ISOVUE-300) INJECTION 61% COMPARISON:  CT abdomen and pelvis 06/24/2017. FINDINGS: Lower chest: No acute abnormality. Hepatobiliary: No focal liver abnormality is seen. Status post cholecystectomy. No biliary dilatation. Pancreas: Unremarkable. No pancreatic ductal dilatation or surrounding inflammatory changes. Spleen: Normal in size without focal abnormality. Adrenals/Urinary Tract: Adrenal glands are unremarkable. Kidneys are normal, without renal calculi, focal lesion, or hydronephrosis. Bladder is unremarkable. Stomach/Bowel: Stomach is within normal limits. Appendix appears normal. Changes of sigmoid diverticulitis are again demonstrated, with stranding of the pericolonic fat, and short segment luminal narrowing. No drainable fluid collection. No free air. Compared with priors, the segmental luminal narrowing appears longer, and the pericolonic inflammatory change appears greater. Vascular/Lymphatic: Aortic atherosclerosis. No enlarged abdominal or pelvic lymph nodes. Reproductive: Prostate is  unremarkable.  Large RIGHT hydrocele. Other: No ascites. Musculoskeletal: Degenerative change lumbar spine. IMPRESSION: Persistent, slight worsening, changes of sigmoid diverticulitis, with slightly longer segment of luminal narrowing and slight increase in pericolonic stranding compared with the study 2 weeks earlier. No free air or drainable fluid collection. Electronically Signed   By: Elsie Stain M.D.   On: 07/06/2017 19:15    Assessment and Plan: This is a 47 y.o. male who presents with persistent acute diverticulitis, failing outpatient management.  I have independently viewed the patient's imaging studies and reviewed his laboratory studies.  Overall, his CT scan tonight shows worsening diverticulitis compared to his previous CT scan, but there is no abscess or perforation.  Discussed with the patient that given the failed outpatient management with oral antibiotics, he should be admitted for IV antibiotics and management of his acute diverticulitis.  No surgical intervention is needed at this point.  Will admit the patient and start him  on IV Zosyn and IV fluid hydration.  He will have appropriate IV pain control.  Will start with only clear liquids, mostly water, and advance slowly depending on his progress.  Likely will need 24 to 48 hours of IV abx and then transition to po.  Patient understands this plan and all of his questions have been answered.   Howie IllJose Luis Thang Flett, MD [facility]

## 2017-07-06 NOTE — ED Notes (Signed)
Pt stated that his pain was "10/10" sitting down in chair, but once he got to stand up he was back to a "5/10." MD notified.

## 2017-07-06 NOTE — ED Notes (Addendum)
Pt states that pain occurred Friday after lifting. Pt states it feels like same pain as before when diverticulitis first began. Pain in middle lower quadrant of "3/10".

## 2017-07-07 DIAGNOSIS — K5732 Diverticulitis of large intestine without perforation or abscess without bleeding: Principal | ICD-10-CM

## 2017-07-07 LAB — BASIC METABOLIC PANEL
ANION GAP: 8 (ref 5–15)
BUN: 14 mg/dL (ref 6–20)
CALCIUM: 8.4 mg/dL — AB (ref 8.9–10.3)
CO2: 26 mmol/L (ref 22–32)
CREATININE: 1.02 mg/dL (ref 0.61–1.24)
Chloride: 103 mmol/L (ref 98–111)
GFR calc Af Amer: >60 mL/min (ref >60)
GLUCOSE: 103 mg/dL — AB (ref 70–99)
Potassium: 3.8 mmol/L (ref 3.5–5.1)
Sodium: 137 mmol/L (ref 135–145)

## 2017-07-07 LAB — CBC WITH DIFFERENTIAL/PLATELET
BASOS PCT: 1 %
Basophils Absolute: 0.1 10*3/uL (ref 0–0.1)
EOS PCT: 2 %
Eosinophils Absolute: 0.3 10*3/uL (ref 0–0.7)
HCT: 41.8 % (ref 40.0–52.0)
Hemoglobin: 14.2 g/dL (ref 13.0–18.0)
Lymphocytes Relative: 18 %
Lymphs Abs: 2.1 10*3/uL (ref 1.0–3.6)
MCH: 29.4 pg (ref 26.0–34.0)
MCHC: 34 g/dL (ref 32.0–36.0)
MCV: 86.3 fL (ref 80.0–100.0)
MONO ABS: 0.9 10*3/uL (ref 0.2–1.0)
MONOS PCT: 8 %
Neutro Abs: 8.3 10*3/uL — ABNORMAL HIGH (ref 1.4–6.5)
Neutrophils Relative %: 71 %
Platelets: 268 10*3/uL (ref 150–440)
RBC: 4.84 MIL/uL (ref 4.40–5.90)
RDW: 13.3 % (ref 11.5–14.5)
WBC: 11.6 10*3/uL — ABNORMAL HIGH (ref 3.8–10.6)

## 2017-07-07 LAB — MAGNESIUM: Magnesium: 2.3 mg/dL (ref 1.7–2.4)

## 2017-07-07 LAB — GLUCOSE, CAPILLARY: Glucose-Capillary: 121 mg/dL — ABNORMAL HIGH (ref 70–99)

## 2017-07-07 MED ORDER — FAMOTIDINE IN NACL 20-0.9 MG/50ML-% IV SOLN
20.0000 mg | Freq: Two times a day (BID) | INTRAVENOUS | Status: DC
  Administered 2017-07-07 – 2017-07-08 (×2): 20 mg via INTRAVENOUS
  Filled 2017-07-07 (×2): qty 50

## 2017-07-07 NOTE — Progress Notes (Signed)
SURGICAL PROGRESS NOTE (cpt [phone])  Hospital Day(s): 1.   Post op day(s):  Marland Kitchen   Interval History: Patient seen and examined, no acute events or new complaints since admission overnight. Patient reports much improved LLQ abdominal pain, denies N/V, fever/chills, CP or SOB with +flatus.  Review of Systems:  Constitutional: denies fever, chills  HEENT: denies cough or congestion  Respiratory: denies any shortness of breath  Cardiovascular: denies chest pain or palpitations  Gastrointestinal: abdominal pain, N/V, and bowel function as per interval history Genitourinary: denies burning with urination or urinary frequency Musculoskeletal: denies pain, decreased motor or sensation Integumentary: denies any other rashes or skin discolorations Neurological: denies HA or vision/hearing changes   Vital signs in last 24 hours: [min-max] current  Temp:  [97.7 F (36.5 C)-98.9 F (37.2 C)] 98.3 F (36.8 C) (06/25 0434) Pulse Rate:  [70-107] 70 (06/25 0434) Resp:  [18-20] 19 (06/25 0434) BP: (123-175)/(77-117) 123/77 (06/25 0434) SpO2:  [93 %-97 %] 97 % (06/25 0434) Weight:  [235 lb (106.6 kg)-241 lb 13.5 oz (109.7 kg)] 241 lb 13.5 oz (109.7 kg) (06/24 2253)     Height: 5\' 10"  (177.8 cm) Weight: 241 lb 13.5 oz (109.7 kg) BMI (Calculated): 34.7   Intake/Output this shift:  No intake/output data recorded.   Intake/Output last 2 shifts:  @IOLAST2SHIFTS @   Physical Exam:  Constitutional: alert, cooperative and no distress  HENT: normocephalic without obvious abnormality  Eyes: PERRL, EOM's grossly intact and symmetric  Neuro: CN II - XII grossly intact and symmetric without deficit  Respiratory: breathing non-labored at rest  Cardiovascular: regular rate and sinus rhythm  Gastrointestinal: soft and non-distended with mild LLQ abdominal tenderness to palpation Musculoskeletal: UE and LE FROM, no edema or wounds, motor and sensation grossly intact, NT   Labs:  CBC Latest Ref Rng & Units  07/07/2017 07/06/2017 06/24/2017  WBC 3.8 - 10.6 K/uL 11.6(H) 15.5(H) 11.7(H)  Hemoglobin 13.0 - 18.0 g/dL 60.4 54.0 98.1  Hematocrit 40.0 - 52.0 % 41.8 47.3 44.9  Platelets 150 - 440 K/uL 268 331 304   CMP Latest Ref Rng & Units 07/07/2017 07/06/2017 06/24/2017  Glucose 70 - 99 mg/dL 191(Y) 95 782(N)  BUN 6 - 20 mg/dL 14 15 13   Creatinine 0.61 - 1.24 mg/dL 5.62 1.30 8.65  Sodium 135 - 145 mmol/L 137 135 139  Potassium 3.5 - 5.1 mmol/L 3.8 3.9 3.9  Chloride 98 - 111 mmol/L 103 102 105  CO2 22 - 32 mmol/L 26 22 25   Calcium 8.9 - 10.3 mg/dL 7.8(I) 9.2 9.1  Total Protein 6.5 - 8.1 g/dL - 7.4 7.6  Total Bilirubin 0.3 - 1.2 mg/dL - 0.4 0.6  Alkaline Phos 38 - 126 U/L - 68 74  AST 15 - 41 U/L - 20 19  ALT 17 - 63 U/L - 28 22    Assessment/Plan: (ICD-10's: K57.30) 47 y.o. male with improving uncomplicated sigmoid colonic diverticulitis               - will start clear liquids diet             - IV antibiotic ongoing (Zosyn)             - pain control prn, minimize narcotics   - may advance to full liquids diet later today and decrease IV fluids             - when tolerating PO, will need to maintain hydration + initially low fiber x 6 weeks, then high  fiber diet             - possibility of surgery with partial colectomy and likely colostomy also discussed if doesn't improve/resolve with antibiotics             - continue to monitor abdominal exam and bowel function             - DVT prophylaxis, ambulation encouraged  All of the above findings and recommendations were discussed with the patient, and all of patient's questions were answered to his expressed satisfaction.  -- Scherrie GerlachJason E. Earlene Plateravis, MD, RPVI [place]: [facility] General Surgery - Partnering for exceptional care. Office: [phone]

## 2017-07-08 LAB — HIV ANTIBODY (ROUTINE TESTING W REFLEX): HIV SCREEN 4TH GENERATION: NONREACTIVE

## 2017-07-08 NOTE — Progress Notes (Signed)
SURGICAL PROGRESS NOTE (cpt [phone])  Hospital Day(s): 2.   Post op day(s):  Jeremiah Neal.   Interval History: Patient seen and examined, no acute events or new complaints overnight. Patient reports his LLQ abdominal pain continues to improve, though hurt somewhat this morning after he awoke having not received any pain medication while sleeping overnight, denies N/V, fever/chills, CP, or SOB and has been tolerating his diet with +flatus, +BM, and +ambulation in the halls.  Review of Systems:  Constitutional: denies fever, chills  HEENT: denies cough or congestion  Respiratory: denies any shortness of breath  Cardiovascular: denies chest pain or palpitations  Gastrointestinal: abdominal pain, N/V, and bowel function as per interval history Genitourinary: denies burning with urination or urinary frequency Musculoskeletal: denies pain, decreased motor or sensation Integumentary: denies any other rashes or skin discolorations Neurological: denies HA or vision/hearing changes   Vital signs in last 24 hours: [min-max] current  Temp:  [98 F (36.7 C)-99.6 F (37.6 C)] 99.6 F (37.6 C) (06/26 0414) Pulse Rate:  [83-91] 88 (06/26 0414) Resp:  [16-20] 20 (06/26 0414) BP: (130-152)/(76-113) 130/76 (06/26 0414) SpO2:  [96 %-99 %] 96 % (06/26 0414)     Height: 5\' 10"  (177.8 cm) Weight: 241 lb 13.5 oz (109.7 kg) BMI (Calculated): 34.7   Intake/Output this shift:  Total I/O In: 2285.8 [P.O.:780; I.V.:1485.8; IV Piggyback:20] Out: -    Intake/Output last 2 shifts:  @IOLAST2SHIFTS @   Physical Exam:  Constitutional: alert, cooperative and no distress  HENT: normocephalic without obvious abnormality  Eyes: PERRL, EOM's grossly intact and symmetric  Neuro: CN II - XII grossly intact and symmetric without deficit  Respiratory: breathing non-labored at rest  Cardiovascular: regular rate and sinus rhythm  Gastrointestinal: soft and non-distended with mild decreased LLQ > suprapubic tenderness to  palpation Musculoskeletal: UE and LE FROM, no edema or wounds, motor and sensation grossly intact, NT   Labs:  CBC Latest Ref Rng & Units 07/07/2017 07/06/2017 06/24/2017  WBC 3.8 - 10.6 K/uL 11.6(H) 15.5(H) 11.7(H)  Hemoglobin 13.0 - 18.0 g/dL 60.414.2 54.015.8 98.115.6  Hematocrit 40.0 - 52.0 % 41.8 47.3 44.9  Platelets 150 - 440 K/uL 268 331 304   CMP Latest Ref Rng & Units 07/07/2017 07/06/2017 06/24/2017  Glucose 70 - 99 mg/dL 191(Y103(H) 95 782(N123(H)  BUN 6 - 20 mg/dL 14 15 13   Creatinine 0.61 - 1.24 mg/dL 5.621.02 1.301.09 8.651.21  Sodium 135 - 145 mmol/L 137 135 139  Potassium 3.5 - 5.1 mmol/L 3.8 3.9 3.9  Chloride 98 - 111 mmol/L 103 102 105  CO2 22 - 32 mmol/L 26 22 25   Calcium 8.9 - 10.3 mg/dL 7.8(I8.4(L) 9.2 9.1  Total Protein 6.5 - 8.1 g/dL - 7.4 7.6  Total Bilirubin 0.3 - 1.2 mg/dL - 0.4 0.6  Alkaline Phos 38 - 126 U/L - 68 74  AST 15 - 41 U/L - 20 19  ALT 17 - 63 U/L - 28 22   Imaging studies: No new pertinent imaging studies   Assessment/Plan: (ICD-10's: 63K57.30) [age] y.o. male with improving uncomplicated sigmoid colonic diverticulitis   - advance to soft diet - IV antibiotic ongoing (Zosyn) - pain control prn, minimize narcotics  - when tolerating PO, will need tomaintain hydration + initially low fiber x 6 weeks, then high fiber diet - possibility of surgery with partial colectomy and likely colostomy also discussed if doesn't improve/resolve with antibiotics - continue to monitor abdominal exam and bowel function - DVT prophylaxis, ambulation encouraged  - anticipate discharge  tomorrow  All of the above findings and recommendations were discussed with the patient, and all of patient's questions were answered to his expressed satisfaction.  -- Scherrie Gerlach Earlene Plater, MD, RPVI [place]: [facility] General Surgery - Partnering for exceptional care. Office: [phone]

## 2017-07-09 MED ORDER — AMOXICILLIN-POT CLAVULANATE 875-125 MG PO TABS: 1.0000 | ORAL_TABLET | Freq: Two times a day (BID) | ORAL | 0 refills | Status: AC

## 2017-07-09 MED ORDER — OXYCODONE-ACETAMINOPHEN 5-325 MG PO TABS: 1.0000 | ORAL_TABLET | Freq: Four times a day (QID) | ORAL | 0 refills | Status: DC | PRN

## 2017-07-09 NOTE — Care Management (Signed)
Patient to discharge today.  Patient was admitted for diverticulitis.  Patient to discharge with prescriptions for pain medication and Augmentin. Patient request for coupons to be printed for [employer].  Coupons printed from [email or web address].  Augmentin $20.15.  No coupon for pain medication available.  Provided with applications for Medication Management , [facility], "The Network:  Your Guide to [employer] and [facility] [place]"  Booklet.

## 2017-07-09 NOTE — Progress Notes (Signed)
Patient discharge teaching given, including activity, diet, follow-up appoints, and medications. Patient verbalized understanding of all discharge instructions. IV access was d/c'd. Vitals are stable. Skin is intact except as charted in most recent assessments. Pt refused to be escorted out, to be driven home by family.  Jeremiah Neal  

## 2017-07-15 NOTE — Discharge Summary (Signed)
Physician Discharge Summary  Patient ID: Neomia DearDavid B Gotts MRN: [id number] DOB/AGE: [birth year] 47 y.o.  Admit date: 07/06/2017 Discharge date: 07/09/2017  Admission Diagnoses:  Discharge Diagnoses:  Active Problems:   Acute diverticulitis   Discharged Condition: good  Hospital Course: 47 y.o. male [facility] for abdominal pain not improving with outpatient oral antibiotics. Workup was found to be significant for CT imaging demonstrating sigmoid colonic diverticulitis.  Patient was admitted to surgical service with antibiotics and bowel rest, after which patient's pain improved/resolved and advancement of patient's diet and ambulation were well-tolerated. The remainder of patient's hospital course was essentially unremarkable, and discharge planning was initiated accordingly with patient safely able to be discharged home with appropriate discharge instructions, antibiotics, and outpatient surgical follow-up after all of his and family's questions were answered to their expressed satisfaction.  Consults: None  Significant Diagnostic Studies: radiology: CT scan: sigmoid colonic diverticulitis  Treatments: IV hydration, antibiotics: Zosyn and analgesia  Discharge Exam: Blood pressure (!) 133/95, pulse 80, temperature 98.4 F (36.9 C), temperature source Oral, resp. rate 20, height 5\' 10"  (1.778 m), weight 241 lb 13.5 oz (109.7 kg), SpO2 96 %. General appearance: alert, cooperative and no distress GI: soft, non-tender; bowel sounds normal; no masses,  no organomegaly  Disposition:    Allergies as of 07/09/2017      Reactions   Penicillins Nausea Only      Medication List    STOP taking these medications   traMADol 50 MG tablet Commonly known as:  ULTRAM     TAKE these medications   amoxicillin-clavulanate 875-125 MG tablet Commonly known as:  AUGMENTIN Take 1 tablet by mouth 2 (two) times daily for 10 days.   oxyCODONE-acetaminophen 5-325 MG tablet Commonly known as:   PERCOCET/ROXICET Take 1 tablet by mouth every 6 (six) hours as needed for severe pain.      Follow-up Information    [facility]. Go on 07/20/2017.   Specialty:  General Surgery Why:  Monday July 8th at 1:30pm with Dr.Pabon for a follow-up appointment  Contact information: [address] [place] [id number] [phone]          Signed: Ancil LinseyJason Evan Jermell Holeman 07/15/2017, 7:28 AM

## 2017-07-20 ENCOUNTER — Inpatient Hospital Stay: Payer: Self-pay | Admitting: Surgery

## 2017-08-24 ENCOUNTER — Emergency Department (HOSPITAL_COMMUNITY): Payer: Self-pay

## 2017-08-24 ENCOUNTER — Encounter (HOSPITAL_COMMUNITY): Payer: Self-pay | Admitting: *Deleted

## 2017-08-24 ENCOUNTER — Other Ambulatory Visit: Payer: Self-pay

## 2017-08-24 ENCOUNTER — Emergency Department (HOSPITAL_COMMUNITY)
Admission: EM | Admit: 2017-08-24 | Discharge: 2017-08-24 | Disposition: A | Discharge status: Home / Self Care | Payer: Self-pay | Attending: Emergency Medicine | Admitting: Emergency Medicine

## 2017-08-24 DIAGNOSIS — R109 Unspecified abdominal pain: Secondary | ICD-10-CM | POA: Insufficient documentation

## 2017-08-24 DIAGNOSIS — M25512 Pain in left shoulder: Secondary | ICD-10-CM | POA: Insufficient documentation

## 2017-08-24 DIAGNOSIS — S30811A Abrasion of abdominal wall, initial encounter: Secondary | ICD-10-CM | POA: Insufficient documentation

## 2017-08-24 DIAGNOSIS — Y999 Unspecified external cause status: Secondary | ICD-10-CM | POA: Insufficient documentation

## 2017-08-24 DIAGNOSIS — R51 Headache: Secondary | ICD-10-CM | POA: Insufficient documentation

## 2017-08-24 DIAGNOSIS — Y9355 Activity, bike riding: Secondary | ICD-10-CM | POA: Insufficient documentation

## 2017-08-24 DIAGNOSIS — R0789 Other chest pain: Secondary | ICD-10-CM | POA: Insufficient documentation

## 2017-08-24 DIAGNOSIS — S0081XA Abrasion of other part of head, initial encounter: Secondary | ICD-10-CM | POA: Insufficient documentation

## 2017-08-24 DIAGNOSIS — Z23 Encounter for immunization: Secondary | ICD-10-CM | POA: Insufficient documentation

## 2017-08-24 DIAGNOSIS — F17210 Nicotine dependence, cigarettes, uncomplicated: Secondary | ICD-10-CM | POA: Insufficient documentation

## 2017-08-24 DIAGNOSIS — Y92007 Garden or yard of unspecified non-institutional (private) residence as the place of occurrence of the external cause: Secondary | ICD-10-CM | POA: Insufficient documentation

## 2017-08-24 HISTORY — DX: Diverticulitis of intestine, part unspecified, without perforation or abscess without bleeding: K57.92

## 2017-08-24 LAB — CDS SEROLOGY

## 2017-08-24 LAB — COMPREHENSIVE METABOLIC PANEL
ALBUMIN: 4.3 g/dL (ref 3.5–5.0)
ALK PHOS: 82 U/L (ref 38–126)
ALT: 35 U/L (ref 0–44)
AST: 25 U/L (ref 15–41)
Anion gap: 10 (ref 5–15)
BILIRUBIN TOTAL: 0.4 mg/dL (ref 0.3–1.2)
BUN: 7 mg/dL (ref 6–20)
CO2: 27 mmol/L (ref 22–32)
CREATININE: 1.15 mg/dL (ref 0.61–1.24)
Calcium: 9.3 mg/dL (ref 8.9–10.3)
Chloride: 104 mmol/L (ref 98–111)
GFR calc Af Amer: >60 mL/min (ref >60)
GFR calc non Af Amer: >60 mL/min (ref >60)
GLUCOSE: 96 mg/dL (ref 70–99)
POTASSIUM: 3.8 mmol/L (ref 3.5–5.1)
Sodium: 141 mmol/L (ref 135–145)
TOTAL PROTEIN: 7.2 g/dL (ref 6.5–8.1)

## 2017-08-24 LAB — CBC
HEMATOCRIT: 50.4 % (ref 39.0–52.0)
Hemoglobin: 16.5 g/dL (ref 13.0–17.0)
MCH: 29 pg (ref 26.0–34.0)
MCHC: 32.7 g/dL (ref 30.0–36.0)
MCV: 88.7 fL (ref 78.0–100.0)
PLATELETS: 287 10*3/uL (ref 150–400)
RBC: 5.68 MIL/uL (ref 4.22–5.81)
RDW: 13.6 % (ref 11.5–15.5)
WBC: 10.2 10*3/uL (ref 4.0–10.5)

## 2017-08-24 LAB — I-STAT CHEM 8, ED
BUN: 11 mg/dL (ref 6–20)
CHLORIDE: 104 mmol/L (ref 98–111)
Calcium, Ion: 1.13 mmol/L — ABNORMAL LOW (ref 1.15–1.40)
Creatinine, Ser: 1.1 mg/dL (ref 0.61–1.24)
Glucose, Bld: 95 mg/dL (ref 70–99)
HEMATOCRIT: 50 % (ref 39.0–52.0)
Hemoglobin: 17 g/dL (ref 13.0–17.0)
POTASSIUM: 3.8 mmol/L (ref 3.5–5.1)
SODIUM: 143 mmol/L (ref 135–145)
TCO2: 27 mmol/L (ref 22–32)

## 2017-08-24 LAB — I-STAT CG4 LACTIC ACID, ED: LACTIC ACID, VENOUS: 1.65 mmol/L (ref 0.5–1.9)

## 2017-08-24 LAB — PROTIME-INR
INR: 0.94
Prothrombin Time: 12.5 seconds (ref 11.4–15.2)

## 2017-08-24 LAB — SAMPLE TO BLOOD BANK

## 2017-08-24 LAB — ETHANOL: Alcohol, Ethyl (B): <10 mg/dL (ref <10)

## 2017-08-24 MED ORDER — ONDANSETRON HCL 4 MG/2ML IJ SOLN
4.0000 mg | Freq: Once | INTRAMUSCULAR | Status: AC
  Administered 2017-08-24: 4 mg via INTRAVENOUS
  Filled 2017-08-24: qty 2

## 2017-08-24 MED ORDER — FENTANYL CITRATE (PF) 100 MCG/2ML IJ SOLN
50.0000 ug | Freq: Once | INTRAMUSCULAR | Status: AC
  Administered 2017-08-24: 50 ug via INTRAVENOUS
  Filled 2017-08-24: qty 2

## 2017-08-24 MED ORDER — IOHEXOL 300 MG/ML  SOLN
100.0000 mL | Freq: Once | INTRAMUSCULAR | Status: AC | PRN
  Administered 2017-08-24: 100 mL via INTRAVENOUS

## 2017-08-24 MED ORDER — TETANUS-DIPHTH-ACELL PERTUSSIS 5-2.5-18.5 LF-MCG/0.5 IM SUSP
0.5000 mL | Freq: Once | INTRAMUSCULAR | Status: AC
  Administered 2017-08-24: 0.5 mL via INTRAMUSCULAR
  Filled 2017-08-24: qty 0.5

## 2017-08-24 NOTE — ED Notes (Signed)
Pt continues to complain of dizziness worse with movement  Pt transported to xray then CT
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
## 2017-08-24 NOTE — Discharge Instructions (Signed)
Jeremiah Neal:  Thank you for allowing us to take care of you today.  We hope you begin feeling better soon.  To-Do: Please follow-up with your primary doctor, especially if you continue to have pain around your scapula Take tylenol and motrin for your discomfort Please return to the Emergency Department or call 911 if you experience chest pain, shortness of breath, severe pain, severe fever, altered mental status, or have any reason to think that you need emergency medical care.  Thank you again.  Hope you feel better soon.

## 2017-08-24 NOTE — ED Notes (Signed)
Pt refusing to wear c-collar and has removed it; provider made aware

## 2017-08-25 ENCOUNTER — Encounter: Payer: Self-pay | Admitting: Emergency Medicine

## 2018-11-17 ENCOUNTER — Ambulatory Visit
Admission: EM | Admit: 2018-11-17 | Discharge: 2018-11-17 | Disposition: A | Discharge status: Home / Self Care | Payer: Medicaid Other | Attending: Family Medicine | Admitting: Family Medicine

## 2018-11-17 ENCOUNTER — Encounter: Payer: Self-pay | Admitting: Emergency Medicine

## 2018-11-17 ENCOUNTER — Other Ambulatory Visit: Payer: Self-pay

## 2018-11-17 DIAGNOSIS — M545 Low back pain: Secondary | ICD-10-CM

## 2018-11-17 DIAGNOSIS — M5442 Lumbago with sciatica, left side: Secondary | ICD-10-CM

## 2018-11-17 DIAGNOSIS — R030 Elevated blood-pressure reading, without diagnosis of hypertension: Secondary | ICD-10-CM | POA: Diagnosis not present

## 2018-11-17 MED ORDER — PREDNISONE 10 MG PO TABS: ORAL_TABLET | ORAL | 0 refills | Status: DC

## 2018-11-17 MED ORDER — OXYCODONE-ACETAMINOPHEN 5-325 MG PO TABS: 1.0000 | ORAL_TABLET | Freq: Three times a day (TID) | ORAL | 0 refills | Status: DC | PRN

## 2018-11-17 NOTE — ED Provider Notes (Signed)
[facility] ____________________________________________  Time seen: Approximately 12:39 PM  I have reviewed the triage vital signs and the nursing notes.   HISTORY  Chief Complaint Back Pain (APPT)   HPI Jeremiah Neal is a 48 y.o. male presenting for evaluation of lower back pain present for last 2 weeks.  Patient reports he has a history of similar with his sciatica after previous injury a few years ago.  States the pain intermittently flares back up.  States usually after about a week it will go away but this has persisted.  Denies any fall or direct injury.  Does report he was pulling a cable and twisted awkwardly which started this flareup.  Pain does intermittently radiate down left leg and described as a burning sensation.  No paresthesias.  Denies urinary bowel retention or incontinence, abdominal pain, chest pain, shortness of breath, rash.  Has continued remain ambulatory.  Did take Tylenol last week which helped some initially but then did not help so he stopped.  Took 1 ibuprofen last night which helped.  Denies other alleviating measures.  Pain is mostly with activity.  Reports otherwise doing well.   Past Medical History:  Diagnosis Date  . Diverticulitis     Patient Active Problem List   Diagnosis Date Noted  . Diverticulitis of large intestine without perforation or abscess without bleeding 07/06/2017    History reviewed. No pertinent surgical history.   No current facility-administered medications for this encounter.   Current Outpatient Medications:  .  acetaminophen (TYLENOL) 500 MG tablet, Take 500 mg by mouth every 6 (six) hours as needed., Disp: , Rfl:  .  oxyCODONE-acetaminophen (PERCOCET/ROXICET) 5-325 MG tablet, Take 1 tablet by mouth every 8 (eight) hours as needed for severe pain. Do not drive while taking as can cause drowsiness., Disp: 8 tablet, Rfl: 0 .  predniSONE (DELTASONE) 10 MG tablet, Start 60 mg po day one, then 50 mg po day two,  taper by 10 mg daily until complete., Disp: 21 tablet, Rfl: 0  Allergies Penicillins and Penicillins  History reviewed. No pertinent family history.  Social History Social History   Tobacco Use  . Smoking status: Current Every Day Smoker    Packs/day: 1.00    Types: Cigarettes  . Smokeless tobacco: Never Used  Substance Use Topics  . Alcohol use: Yes    Comment: occasionally  . Drug use: Not Currently    Types: Marijuana    Review of Systems Constitutional: No fever ENT: No sore throat. Cardiovascular: Denies chest pain. Respiratory: Denies shortness of breath. Gastrointestinal: No abdominal pain.  No nausea, no vomiting.  No diarrhea.  No constipation. Genitourinary: Negative for dysuria. Musculoskeletal: Positive for back pain. Skin: Negative for rash. Neurological: Negative for headaches, focal weakness or numbness.    ____________________________________________   PHYSICAL EXAM:  VITAL SIGNS: ED Triage Vitals  Enc Vitals Group     BP 11/17/18 1211 (!) 170/110     Pulse Rate 11/17/18 1211 93     Resp 11/17/18 1211 18     Temp 11/17/18 1211 97.8 F (36.6 C)     Temp Source 11/17/18 1211 Oral     SpO2 11/17/18 1211 99 %     Weight --      Height 11/17/18 1210 5\' 10"  (1.778 m)     Head Circumference --      Peak Flow --      Pain Score 11/17/18 1209 10     Pain Loc --  Pain Edu? --      Excl. in [place]? --     Constitutional: Alert and oriented. Well appearing and in no acute distress. Eyes: Conjunctivae are normal.  ENT      Head: Normocephalic and atraumatic. Cardiovascular: Normal rate, regular rhythm. Grossly normal heart sounds.  Good peripheral circulation. Respiratory: Normal respiratory effort without tachypnea nor retractions. Breath sounds are clear and equal bilaterally. No wheezes, rales, rhonchi. Gastrointestinal: Soft and nontender. NoNo CVA tenderness. Musculoskeletal:   No midline cervical, thoracic or lumbar tenderness to palpation.  Bilateral pedal pulses equal and easily palpated. Except: Left lower paralumbar tenderness into left greater sciatic notch, no midline tenderness, no edema, no pelvic tenderness, no pain with standing right knee left, mild pain with left knee left, pain with lumbar flexion and extension, no pain to the right or left lumbar rotation, steady gait, changes positions quickly, no saddle anesthesia. Neurologic:  Normal speech and language. No gross focal neurologic deficits are appreciated. Speech is normal. No gait instability.  Skin:  Skin is warm, dry and intact. No rash noted. Psychiatric: Mood and affect are normal. Speech and behavior are normal. Patient exhibits appropriate insight and judgment   ___________________________________________   LABS (all labs ordered are listed, but only abnormal results are displayed)  Labs Reviewed - No data to display   PROCEDURES Procedures    INITIAL IMPRESSION / ASSESSMENT AND PLAN / ED COURSE  Pertinent labs & imaging results that were available during my care of the patient were reviewed by me and considered in my medical decision making (see chart for details).  Overall well-appearing patient.  No acute distress.  Left lower back pain, suspect sciatica flare.  No direct trauma, will defer x-ray, patient agrees.  Will treat with oral prednisone and Percocet as needed for breakthrough pain.  Quantity 8 Percocet given.  Patient blood pressure elevated in office, patient reports this is atypical for him and suspect due to pain.  Counseled to monitor.  Patient states he has a blood pressure cuff at home and will monitor and follow-up with primary as needed.Discussed indication, risks and benefits of medications with patient.   Discussed follow up and return parameters including no resolution or any worsening concerns. Patient verbalized understanding and agreed to plan.   [place] controlled substance database reviewed, no recent controlled  substances documented.  ____________________________________________   FINAL CLINICAL IMPRESSION(S) / ED DIAGNOSES  Final diagnoses:  Acute left-sided low back pain with left-sided sciatica  Elevated blood pressure reading     ED Discharge Orders         Ordered    predniSONE (DELTASONE) 10 MG tablet     11/17/18 1240    oxyCODONE-acetaminophen (PERCOCET/ROXICET) 5-325 MG tablet  Every 8 hours PRN     11/17/18 1240           Note: This dictation was prepared with Dragon dictation along with smaller phrase technology. Any transcriptional errors that result from this process are unintentional.         Marylene Land, NP 11/17/18 1308

## 2018-11-17 NOTE — ED Triage Notes (Signed)
Patient c/o left side sciatica pain that radiates into the back of his left leg x 2 weeks. He states he has had this before but it's been several years.

## 2018-11-17 NOTE — Discharge Instructions (Addendum)
Take medication as prescribed. Rest. Drink plenty of fluids.   Monitor your blood pressure and follow up, as discussed.   Follow up with your primary care physician this week as needed. Return to Urgent care for new or worsening concerns.

## 2019-01-31 DIAGNOSIS — G8929 Other chronic pain: Secondary | ICD-10-CM | POA: Insufficient documentation

## 2019-01-31 DIAGNOSIS — R519 Headache, unspecified: Secondary | ICD-10-CM | POA: Insufficient documentation

## 2019-01-31 DIAGNOSIS — R42 Dizziness and giddiness: Secondary | ICD-10-CM | POA: Insufficient documentation

## 2020-01-14 DIAGNOSIS — Z419 Encounter for procedure for purposes other than remedying health state, unspecified: Secondary | ICD-10-CM | POA: Diagnosis not present

## 2020-02-14 DIAGNOSIS — Z419 Encounter for procedure for purposes other than remedying health state, unspecified: Secondary | ICD-10-CM | POA: Diagnosis not present

## 2020-12-13 DIAGNOSIS — Z419 Encounter for procedure for purposes other than remedying health state, unspecified: Secondary | ICD-10-CM | POA: Diagnosis not present

## 2021-01-13 DIAGNOSIS — Z419 Encounter for procedure for purposes other than remedying health state, unspecified: Secondary | ICD-10-CM | POA: Diagnosis not present

## 2021-02-13 DIAGNOSIS — Z419 Encounter for procedure for purposes other than remedying health state, unspecified: Secondary | ICD-10-CM | POA: Diagnosis not present

## 2021-03-13 DIAGNOSIS — Z419 Encounter for procedure for purposes other than remedying health state, unspecified: Secondary | ICD-10-CM | POA: Diagnosis not present

## 2021-04-04 DIAGNOSIS — E7800 Pure hypercholesterolemia, unspecified: Secondary | ICD-10-CM | POA: Insufficient documentation

## 2021-04-13 DIAGNOSIS — Z419 Encounter for procedure for purposes other than remedying health state, unspecified: Secondary | ICD-10-CM | POA: Diagnosis not present

## 2021-05-13 DIAGNOSIS — Z419 Encounter for procedure for purposes other than remedying health state, unspecified: Secondary | ICD-10-CM | POA: Diagnosis not present

## 2021-06-11 ENCOUNTER — Other Ambulatory Visit: Payer: Self-pay

## 2021-06-11 ENCOUNTER — Emergency Department (HOSPITAL_COMMUNITY): Payer: Medicaid Other

## 2021-06-11 ENCOUNTER — Encounter (HOSPITAL_COMMUNITY): Payer: Self-pay | Admitting: Emergency Medicine

## 2021-06-11 ENCOUNTER — Emergency Department (HOSPITAL_COMMUNITY)
Admission: EM | Admit: 2021-06-11 | Discharge: 2021-06-11 | Disposition: A | Discharge status: Home / Self Care | Payer: Medicaid Other | Attending: Emergency Medicine | Admitting: Emergency Medicine

## 2021-06-11 DIAGNOSIS — R0789 Other chest pain: Secondary | ICD-10-CM | POA: Diagnosis not present

## 2021-06-11 DIAGNOSIS — R079 Chest pain, unspecified: Secondary | ICD-10-CM | POA: Diagnosis not present

## 2021-06-11 DIAGNOSIS — R0602 Shortness of breath: Secondary | ICD-10-CM | POA: Insufficient documentation

## 2021-06-11 DIAGNOSIS — H53149 Visual discomfort, unspecified: Secondary | ICD-10-CM | POA: Insufficient documentation

## 2021-06-11 DIAGNOSIS — R519 Headache, unspecified: Secondary | ICD-10-CM | POA: Insufficient documentation

## 2021-06-11 LAB — COMPREHENSIVE METABOLIC PANEL
ALT: 29 U/L (ref 0–44)
AST: 20 U/L (ref 15–41)
Albumin: 3.7 g/dL (ref 3.5–5.0)
Alkaline Phosphatase: 77 U/L (ref 38–126)
Anion gap: 11 (ref 5–15)
BUN: 10 mg/dL (ref 6–20)
CO2: 23 mmol/L (ref 22–32)
Calcium: 8.9 mg/dL (ref 8.9–10.3)
Chloride: 100 mmol/L (ref 98–111)
Creatinine, Ser: 0.97 mg/dL (ref 0.61–1.24)
GFR, Estimated: >60 mL/min (ref >60)
Glucose, Bld: 206 mg/dL — ABNORMAL HIGH (ref 70–99)
Potassium: 4 mmol/L (ref 3.5–5.1)
Sodium: 134 mmol/L — ABNORMAL LOW (ref 135–145)
Total Bilirubin: 0.6 mg/dL (ref 0.3–1.2)
Total Protein: 6.2 g/dL — ABNORMAL LOW (ref 6.5–8.1)

## 2021-06-11 LAB — CBC
HCT: 42.9 % (ref 39.0–52.0)
Hemoglobin: 14.6 g/dL (ref 13.0–17.0)
MCH: 29.5 pg (ref 26.0–34.0)
MCHC: 34 g/dL (ref 30.0–36.0)
MCV: 86.7 fL (ref 80.0–100.0)
Platelets: 264 10*3/uL (ref 150–400)
RBC: 4.95 MIL/uL (ref 4.22–5.81)
RDW: 13 % (ref 11.5–15.5)
WBC: 10 10*3/uL (ref 4.0–10.5)
nRBC: 0 % (ref 0.0–0.2)

## 2021-06-11 LAB — TROPONIN I (HIGH SENSITIVITY)
Troponin I (High Sensitivity): 8 ng/L (ref <18)
Troponin I (High Sensitivity): 8 ng/L (ref <18)

## 2021-06-11 LAB — D-DIMER, QUANTITATIVE: D-Dimer, Quant: 0.36 ug/mL-FEU (ref 0.00–0.50)

## 2021-06-11 MED ORDER — METOCLOPRAMIDE HCL 5 MG/ML IJ SOLN
10.0000 mg | Freq: Once | INTRAMUSCULAR | Status: DC
  Filled 2021-06-11: qty 2

## 2021-06-11 MED ORDER — DIPHENHYDRAMINE HCL 50 MG/ML IJ SOLN
25.0000 mg | Freq: Once | INTRAMUSCULAR | Status: DC
  Filled 2021-06-11: qty 1

## 2021-06-11 NOTE — ED Notes (Signed)
Pt ambulatory to the bathroom independently.

## 2021-06-11 NOTE — ED Provider Notes (Signed)
[facility] EMERGENCY DEPARTMENT Provider Note   CSN: [id number] Arrival date & time: 06/11/21  4650     History  Chief Complaint  Patient presents with   Chest Pain    Jeremiah Neal is a 51 y.o. male presenting to the ED with a chief complaint of chest pain and headache. States that his headache began a week ago.  Intermittent headache which she associates with his high blood pressure.  Reports systolic blood pressures ranging in the 90s-190s over the past week.  He has been compliant with his losartan and amlodipine.  Reports associated photophobia.  Denies any numbness, weakness, neck stiffness, fever, vomiting or vision changes.  Has not taken any medication help with the symptoms.  No specific aggravating or alleviating factor. Also reports intermittent left-sided chest pain.  States that the pain varies in severity and timing but will typically last a few seconds and improve on its own.  Reports associated shortness of breath which she feels is unrelated to the pain.  Denies any leg swelling.  Has not taken any medication to help with the pain.  Denies history of DVT or PE, recent immobilization, abdominal pain, vomiting or fever.   Chest Pain Associated symptoms: headache and shortness of breath   Associated symptoms: no abdominal pain, no cough, no dizziness, no fever, no nausea, no palpitations, no vomiting and no weakness       Home Medications Prior to Admission medications   Medication Sig Start Date End Date Taking? Authorizing Provider  acetaminophen (TYLENOL) 500 MG tablet Take 500 mg by mouth every 6 (six) hours as needed for mild pain.   Yes [provider]  amLODipine (NORVASC) 10 MG tablet Take 10 mg by mouth daily. 05/29/21  Yes [provider]  aspirin EC 81 MG tablet Take 81 mg by mouth daily as needed for moderate pain. Swallow whole.   Yes [provider]  atorvastatin (LIPITOR) 80 MG tablet Take 80 mg by mouth daily.  06/08/21  Yes [provider]  losartan (COZAAR) 100 MG tablet Take 100 mg by mouth daily. 05/29/21  Yes [provider]  metFORMIN (GLUCOPHAGE) 1000 MG tablet Take 1,000 mg by mouth 2 (two) times daily. 05/27/21  Yes [provider]      Allergies    Penicillins and Penicillins    Review of Systems   Review of Systems  Constitutional:  Negative for appetite change, chills and fever.  HENT:  Negative for ear pain, rhinorrhea, sneezing and sore throat.   Eyes:  Negative for photophobia and visual disturbance.  Respiratory:  Positive for shortness of breath. Negative for cough, chest tightness and wheezing.   Cardiovascular:  Positive for chest pain. Negative for palpitations.  Gastrointestinal:  Negative for abdominal pain, blood in stool, constipation, diarrhea, nausea and vomiting.  Genitourinary:  Negative for dysuria, hematuria and urgency.  Musculoskeletal:  Negative for myalgias.  Skin:  Negative for rash.  Neurological:  Positive for headaches. Negative for dizziness, weakness and light-headedness.   Physical Exam Updated Vital Signs BP 135/86   Pulse 84   Temp 98.6 F (37 C) (Oral)   Resp 16   Ht 5\' 10"  (1.778 m)   Wt 107.5 kg   SpO2 98%   BMI 34.01 kg/m  Physical Exam Vitals and nursing note reviewed.  Constitutional:      General: He is not in acute distress.    Appearance: He is well-developed.  HENT:     Head:  Normocephalic and atraumatic.     Nose: Nose normal.  Eyes:     General: No scleral icterus.       Left eye: No discharge.     Conjunctiva/sclera: Conjunctivae normal.  Cardiovascular:     Rate and Rhythm: Normal rate and regular rhythm.     Heart sounds: Normal heart sounds. No murmur heard.   No friction rub. No gallop.  Pulmonary:     Effort: Pulmonary effort is normal. No respiratory distress.     Breath sounds: Normal breath sounds.  Abdominal:     General: Bowel sounds are normal. There is no distension.      Palpations: Abdomen is soft.     Tenderness: There is no abdominal tenderness. There is no guarding.  Musculoskeletal:        General: Normal range of motion.     Cervical back: Normal range of motion and neck supple.     Right lower leg: No edema.     Left lower leg: No edema.     Comments: No lower extremity edema, erythema or calf tenderness bilaterally.  2+ DP pulse noted bilateral lower extremities.  Skin:    General: Skin is warm and dry.     Findings: No rash.  Neurological:     Mental Status: He is alert and oriented to person, place, and time.     Cranial Nerves: No cranial nerve deficit.     Motor: No weakness or abnormal muscle tone.     Coordination: Coordination normal.     Comments: Pupils reactive. No facial asymmetry noted. Cranial nerves appear grossly intact. Sensation intact to light touch on face, BUE and BLE. Strength 5/5 in BUE and BLE.     ED Results / Procedures / Treatments   Labs (all labs ordered are listed, but only abnormal results are displayed) Labs Reviewed  COMPREHENSIVE METABOLIC PANEL - Abnormal; Notable for the following components:      Result Value   Sodium 134 (*)    Glucose, Bld 206 (*)    Total Protein 6.2 (*)    All other components within normal limits  CBC  D-DIMER, QUANTITATIVE  TROPONIN I (HIGH SENSITIVITY)  TROPONIN I (HIGH SENSITIVITY)    EKG EKG Interpretation  Date/Time:  Tuesday Jun 11 2021 09:43:05 EDT Ventricular Rate:  94 PR Interval:  171 QRS Duration: 96 QT Interval:  365 QTC Calculation: 457 R Axis:   80 Text Interpretation: Sinus rhythm Anteroseptal infarct, old no acute ST/T changes No old tracing to compare Confirmed by Pricilla LovelessGoldston, Scott [phone]([id number]) on 06/11/2021 9:44:16 AM  Radiology DG Chest 2 View  Result Date: 06/11/2021 CLINICAL DATA:  Chest pain. EXAM: CHEST - 2 VIEW COMPARISON:  Chest radiograph 08/24/2017. FINDINGS: No consolidation. No visible pleural effusions or pneumothorax. Cardiomediastinal silhouette  is within normal limits. No displaced fracture. IMPRESSION: No evidence of acute cardiopulmonary disease. Electronically Signed   By: Feliberto HartsFrederick S Jones M.D.   On: 06/11/2021 10:18   CT HEAD WO CONTRAST (5MM)  Result Date: 06/11/2021 CLINICAL DATA:  Headache, new or worsening (Age >= 50y) EXAM: CT HEAD WITHOUT CONTRAST TECHNIQUE: Contiguous axial images were obtained from the base of the skull through the vertex without intravenous contrast. RADIATION DOSE REDUCTION: This exam was performed according to the departmental dose-optimization program which includes automated exposure control, adjustment of the mA and/or kV according to patient size and/or use of iterative reconstruction technique. COMPARISON:  None Available. FINDINGS: Brain: No evidence of acute infarction, hemorrhage, hydrocephalus,  extra-axial collection or mass lesion/mass effect. Vascular: No hyperdense vessel identified. Skull: No acute fracture. Sinuses/Orbits: Mild paranasal sinus mucosal thickening. No acute orbital findings. Other: No mastoid effusions. IMPRESSION: No evidence of acute intracranial abnormality. Electronically Signed   By: Feliberto Harts M.D.   On: 06/11/2021 10:32    Procedures Procedures    Medications Ordered in ED Medications - No data to display  ED Course/ Medical Decision Making/ A&P Clinical Course as of 06/11/21 1337  Tue Jun 11, 2021  1051 D-Dimer, Quant: 0.36 [HK]  1113 Patient declining migraine cocktail. [HK]  1113 Troponin I (High Sensitivity): 8 [HK]  1124 Glucose(!): 206 [HK]    Clinical Course User Index [HK] Dietrich Pates, PA-C                           Medical Decision Making Amount and/or Complexity of Data Reviewed Labs: ordered. Decision-making details documented in ED Course. Radiology: ordered.   51 year old male presenting to the ED for chest pain and headache.  Intermittent headache for the past week without specific aggravating alleviating factor with associated  photophobia.  States that his blood pressures have been fluctuating over the past week despite taking his antihypertensive.  He denies any blurry vision, numbness, weakness, head injury, neck stiffness or fever.  On exam patient without neurological deficits.  No numbness or weakness on exam.  No facial asymmetry.  No aphasia.  He is hypertensive here to 150 systolic initially.  Will obtain CT of the head due to worsening headache over the age of 51 years old.  Also reports left-sided intermittent chest pain.  Reports shortness of breath which she feels is unrelated to the chest pain.  No leg swelling.  No prior cardiac or pulmonary history that he is aware of.  No recent immobilization.  Will obtain labs, EKG and reassess.  EKG shows sinus rhythm, no ischemic changes, no STEMI.  Chest x-ray is unremarkable.  CT of the head shows no acute findings, no masses or lesions, no bleeds.  CMP, CBC unremarkable.  D-dimer obtained due to patient's age, chest pain and shortness of breath.  This was negative so I doubt PE as the cause of his symptoms and will forego CTA of the chest at this time.  Initial delta troponin are both negative.  Patient is a low risk by heart score based on his history and today's work-up.  Patient remains asymptomatic during entire ED visit.  I did offer medication for headache but he declined stating that he does not currently have a headache.  I doubt dissection as the cause of his symptoms based on risk factors, physical exam findings.  No structural cause seen on chest x-ray to cause his symptoms. He is agreeable to discharge home, following up with PCP as well as cardiology There are no headache characteristics that are lateralizing or concerning for increased ICP, infectious or vascular cause of his symptoms.   Return precautions given.    Patient is hemodynamically stable, in NAD, and able to ambulate in the ED. Evaluation does not show pathology that would require ongoing emergent  intervention or inpatient treatment. I explained the diagnosis to the patient. Pain has been managed and has no complaints prior to discharge. Patient is comfortable with above plan and is stable for discharge at this time. All questions were answered prior to disposition. Strict return precautions for returning to the ED were discussed. Encouraged follow up with PCP.  An After Visit Summary was printed and given to the patient.   Portions of this note were generated with Scientist, clinical (histocompatibility and immunogenetics). Dictation errors may occur despite best attempts at proofreading.        Final Clinical Impression(s) / ED Diagnoses Final diagnoses:  Chest wall pain    Rx / DC Orders ED Discharge Orders     None         Dietrich Pates, PA-C 06/11/21 1337    Pricilla Loveless, MD 06/11/21 1624

## 2021-06-11 NOTE — Discharge Instructions (Addendum)
Your work-up today was reassuring. You will need to talk to your primary care provider regarding adjusting your medication for your blood pressure if needed.  Continue to keep a log of your blood pressures at home. You can take Tylenol or Motrin as needed to help with your headache. You will need to follow-up with a cardiologist as well which I have provided below. Return to the ER at any point if your symptoms worsen, you have worsening chest pain, blurry vision, numbness in your arms or legs, trouble walking, trouble breathing or leg swelling

## 2021-06-11 NOTE — ED Triage Notes (Signed)
Pt reports intermittent left sided c/p since last week, worsening this morning. Non radiating. Pt also endorses severe HA since this AM. Pt reports SBP in the 190's when it was checked this AM.

## 2021-06-13 DIAGNOSIS — Z419 Encounter for procedure for purposes other than remedying health state, unspecified: Secondary | ICD-10-CM | POA: Diagnosis not present

## 2021-07-13 DIAGNOSIS — Z419 Encounter for procedure for purposes other than remedying health state, unspecified: Secondary | ICD-10-CM | POA: Diagnosis not present

## 2021-08-13 DIAGNOSIS — Z419 Encounter for procedure for purposes other than remedying health state, unspecified: Secondary | ICD-10-CM | POA: Diagnosis not present

## 2021-09-13 DIAGNOSIS — Z419 Encounter for procedure for purposes other than remedying health state, unspecified: Secondary | ICD-10-CM | POA: Diagnosis not present

## 2021-10-13 DIAGNOSIS — Z419 Encounter for procedure for purposes other than remedying health state, unspecified: Secondary | ICD-10-CM | POA: Diagnosis not present

## 2021-11-13 DIAGNOSIS — Z419 Encounter for procedure for purposes other than remedying health state, unspecified: Secondary | ICD-10-CM | POA: Diagnosis not present

## 2021-11-26 DIAGNOSIS — Z6833 Body mass index (BMI) 33.0-33.9, adult: Secondary | ICD-10-CM | POA: Diagnosis not present

## 2021-11-26 DIAGNOSIS — M19011 Primary osteoarthritis, right shoulder: Secondary | ICD-10-CM | POA: Diagnosis not present

## 2021-11-26 DIAGNOSIS — Z1211 Encounter for screening for malignant neoplasm of colon: Secondary | ICD-10-CM | POA: Diagnosis not present

## 2021-11-26 DIAGNOSIS — E119 Type 2 diabetes mellitus without complications: Secondary | ICD-10-CM | POA: Diagnosis not present

## 2021-12-03 DIAGNOSIS — M25511 Pain in right shoulder: Secondary | ICD-10-CM | POA: Diagnosis not present

## 2021-12-04 DIAGNOSIS — E118 Type 2 diabetes mellitus with unspecified complications: Secondary | ICD-10-CM | POA: Diagnosis not present

## 2021-12-12 DIAGNOSIS — Z6832 Body mass index (BMI) 32.0-32.9, adult: Secondary | ICD-10-CM | POA: Diagnosis not present

## 2021-12-12 DIAGNOSIS — E118 Type 2 diabetes mellitus with unspecified complications: Secondary | ICD-10-CM | POA: Diagnosis not present

## 2021-12-13 DIAGNOSIS — Z419 Encounter for procedure for purposes other than remedying health state, unspecified: Secondary | ICD-10-CM | POA: Diagnosis not present

## 2021-12-23 DIAGNOSIS — M25511 Pain in right shoulder: Secondary | ICD-10-CM | POA: Diagnosis not present

## 2021-12-31 DIAGNOSIS — M25511 Pain in right shoulder: Secondary | ICD-10-CM | POA: Diagnosis not present

## 2022-01-13 DIAGNOSIS — Z419 Encounter for procedure for purposes other than remedying health state, unspecified: Secondary | ICD-10-CM | POA: Diagnosis not present

## 2022-02-13 DIAGNOSIS — Z419 Encounter for procedure for purposes other than remedying health state, unspecified: Secondary | ICD-10-CM | POA: Diagnosis not present

## 2022-02-24 DIAGNOSIS — E118 Type 2 diabetes mellitus with unspecified complications: Secondary | ICD-10-CM | POA: Diagnosis not present

## 2022-02-24 DIAGNOSIS — H6692 Otitis media, unspecified, left ear: Secondary | ICD-10-CM | POA: Diagnosis not present

## 2022-02-24 DIAGNOSIS — Z6834 Body mass index (BMI) 34.0-34.9, adult: Secondary | ICD-10-CM | POA: Diagnosis not present

## 2022-03-14 DIAGNOSIS — Z419 Encounter for procedure for purposes other than remedying health state, unspecified: Secondary | ICD-10-CM | POA: Diagnosis not present

## 2022-03-17 DIAGNOSIS — E119 Type 2 diabetes mellitus without complications: Secondary | ICD-10-CM | POA: Diagnosis not present

## 2022-03-17 DIAGNOSIS — Z6835 Body mass index (BMI) 35.0-35.9, adult: Secondary | ICD-10-CM | POA: Diagnosis not present

## 2022-03-17 DIAGNOSIS — R072 Precordial pain: Secondary | ICD-10-CM | POA: Diagnosis not present

## 2022-03-17 DIAGNOSIS — G5603 Carpal tunnel syndrome, bilateral upper limbs: Secondary | ICD-10-CM | POA: Diagnosis not present

## 2022-04-07 DIAGNOSIS — M542 Cervicalgia: Secondary | ICD-10-CM | POA: Diagnosis not present

## 2022-04-14 DIAGNOSIS — Z419 Encounter for procedure for purposes other than remedying health state, unspecified: Secondary | ICD-10-CM | POA: Diagnosis not present

## 2022-05-14 DIAGNOSIS — Z419 Encounter for procedure for purposes other than remedying health state, unspecified: Secondary | ICD-10-CM | POA: Diagnosis not present

## 2022-06-14 DIAGNOSIS — Z419 Encounter for procedure for purposes other than remedying health state, unspecified: Secondary | ICD-10-CM | POA: Diagnosis not present

## 2022-06-16 DIAGNOSIS — E118 Type 2 diabetes mellitus with unspecified complications: Secondary | ICD-10-CM | POA: Diagnosis not present

## 2022-06-16 DIAGNOSIS — E782 Mixed hyperlipidemia: Secondary | ICD-10-CM | POA: Diagnosis not present

## 2022-06-16 DIAGNOSIS — N522 Drug-induced erectile dysfunction: Secondary | ICD-10-CM | POA: Diagnosis not present

## 2022-06-16 DIAGNOSIS — I10 Essential (primary) hypertension: Secondary | ICD-10-CM | POA: Diagnosis not present

## 2022-07-14 DIAGNOSIS — Z419 Encounter for procedure for purposes other than remedying health state, unspecified: Secondary | ICD-10-CM | POA: Diagnosis not present

## 2022-08-14 DIAGNOSIS — Z419 Encounter for procedure for purposes other than remedying health state, unspecified: Secondary | ICD-10-CM | POA: Diagnosis not present

## 2022-09-14 DIAGNOSIS — Z419 Encounter for procedure for purposes other than remedying health state, unspecified: Secondary | ICD-10-CM | POA: Diagnosis not present

## 2022-10-14 DIAGNOSIS — Z419 Encounter for procedure for purposes other than remedying health state, unspecified: Secondary | ICD-10-CM | POA: Diagnosis not present

## 2022-10-16 ENCOUNTER — Emergency Department (HOSPITAL_BASED_OUTPATIENT_CLINIC_OR_DEPARTMENT_OTHER): Payer: Medicaid Other | Admitting: Radiology

## 2022-10-16 ENCOUNTER — Emergency Department (HOSPITAL_BASED_OUTPATIENT_CLINIC_OR_DEPARTMENT_OTHER)
Admission: EM | Admit: 2022-10-16 | Discharge: 2022-10-16 | Disposition: A | Discharge status: Home / Self Care | Payer: Medicaid Other | Attending: Emergency Medicine | Admitting: Emergency Medicine

## 2022-10-16 ENCOUNTER — Other Ambulatory Visit: Payer: Self-pay

## 2022-10-16 ENCOUNTER — Encounter (HOSPITAL_BASED_OUTPATIENT_CLINIC_OR_DEPARTMENT_OTHER): Payer: Self-pay | Admitting: Urology

## 2022-10-16 ENCOUNTER — Emergency Department (HOSPITAL_BASED_OUTPATIENT_CLINIC_OR_DEPARTMENT_OTHER): Payer: Medicaid Other

## 2022-10-16 DIAGNOSIS — Z7982 Long term (current) use of aspirin: Secondary | ICD-10-CM | POA: Diagnosis not present

## 2022-10-16 DIAGNOSIS — S3981XA Other specified injuries of abdomen, initial encounter: Secondary | ICD-10-CM | POA: Diagnosis present

## 2022-10-16 DIAGNOSIS — R0789 Other chest pain: Secondary | ICD-10-CM | POA: Diagnosis not present

## 2022-10-16 DIAGNOSIS — R1031 Right lower quadrant pain: Secondary | ICD-10-CM | POA: Diagnosis not present

## 2022-10-16 DIAGNOSIS — X58XXXA Exposure to other specified factors, initial encounter: Secondary | ICD-10-CM | POA: Diagnosis not present

## 2022-10-16 DIAGNOSIS — S301XXA Contusion of abdominal wall, initial encounter: Secondary | ICD-10-CM | POA: Insufficient documentation

## 2022-10-16 DIAGNOSIS — R0781 Pleurodynia: Secondary | ICD-10-CM | POA: Diagnosis not present

## 2022-10-16 LAB — CBC WITH DIFFERENTIAL/PLATELET
Abs Immature Granulocytes: 0.04 10*3/uL (ref 0.00–0.07)
Basophils Absolute: 0 10*3/uL (ref 0.0–0.1)
Basophils Relative: 0 %
Eosinophils Absolute: 0.2 10*3/uL (ref 0.0–0.5)
Eosinophils Relative: 3 %
HCT: 38.9 % — ABNORMAL LOW (ref 39.0–52.0)
Hemoglobin: 13.3 g/dL (ref 13.0–17.0)
Immature Granulocytes: 1 %
Lymphocytes Relative: 22 %
Lymphs Abs: 1.8 10*3/uL (ref 0.7–4.0)
MCH: 28.9 pg (ref 26.0–34.0)
MCHC: 34.2 g/dL (ref 30.0–36.0)
MCV: 84.6 fL (ref 80.0–100.0)
Monocytes Absolute: 0.6 10*3/uL (ref 0.1–1.0)
Monocytes Relative: 7 %
Neutro Abs: 5.7 10*3/uL (ref 1.7–7.7)
Neutrophils Relative %: 67 %
Platelets: 237 10*3/uL (ref 150–400)
RBC: 4.6 MIL/uL (ref 4.22–5.81)
RDW: 13.8 % (ref 11.5–15.5)
WBC: 8.4 10*3/uL (ref 4.0–10.5)
nRBC: 0 % (ref 0.0–0.2)

## 2022-10-16 LAB — LIPASE, BLOOD: Lipase: 42 U/L (ref 11–51)

## 2022-10-16 LAB — COMPREHENSIVE METABOLIC PANEL
ALT: 31 U/L (ref 0–44)
AST: 20 U/L (ref 15–41)
Albumin: 4.1 g/dL (ref 3.5–5.0)
Alkaline Phosphatase: 57 U/L (ref 38–126)
Anion gap: 11 (ref 5–15)
BUN: 23 mg/dL — ABNORMAL HIGH (ref 6–20)
CO2: 27 mmol/L (ref 22–32)
Calcium: 9.3 mg/dL (ref 8.9–10.3)
Chloride: 102 mmol/L (ref 98–111)
Creatinine, Ser: 0.99 mg/dL (ref 0.61–1.24)
GFR, Estimated: >60 mL/min (ref >60)
Glucose, Bld: 196 mg/dL — ABNORMAL HIGH (ref 70–99)
Potassium: 3.7 mmol/L (ref 3.5–5.1)
Sodium: 140 mmol/L (ref 135–145)
Total Bilirubin: 0.5 mg/dL (ref 0.3–1.2)
Total Protein: 6.7 g/dL (ref 6.5–8.1)

## 2022-10-16 LAB — APTT: aPTT: 25 s (ref 24–36)

## 2022-10-16 LAB — PROTIME-INR
INR: 1 (ref 0.8–1.2)
Prothrombin Time: 12.9 s (ref 11.4–15.2)

## 2022-10-16 MED ORDER — IOHEXOL 300 MG/ML  SOLN
100.0000 mL | Freq: Once | INTRAMUSCULAR | Status: AC | PRN
  Administered 2022-10-16: 100 mL via INTRAVENOUS

## 2022-10-16 NOTE — ED Provider Notes (Signed)
I provided a substantive portion of the care of this patient.  I personally made/approved the management plan for this patient and take responsibility for the patient management.    52 year old male presents with 1 day history of extensive bruising to his abdomen which has been atraumatic.  No prior history of bleeding disorders.  Does not take any blood thinners.  On exam, he has right lower abdominal ecchymosis.  He is not tender.  Labs here are reassuring.  Will perform abdominal CT at this time.      Lorre Nick, MD 10/16/22 [phone]

## 2022-10-16 NOTE — Discharge Instructions (Signed)
Please follow-up with your primary care provider the 1 I have attached you for you in regards to recent symptoms and ER visit.  Today your labs and imaging did not show any traumatic causes of your bruising.  Please ice the area and monitor.  If symptoms change or worsen please return to ER.

## 2022-10-16 NOTE — ED Notes (Signed)
Pt informed that he was waiting on imaging to be resulted... Until then not able to get anything to drink or eat.Marland KitchenMarland Kitchen

## 2022-10-16 NOTE — ED Triage Notes (Signed)
Lower Abdominal bruising very extensive that was noticed yesterday, did have chest injury 1 week ago with small bruise noted to chest  Continued pain to right side of chest with cough  Denies any injury  Denies blood thinners

## 2022-10-16 NOTE — ED Provider Notes (Signed)
Patient given in sign out by Jeanelle Malling, PA-C.  Please review their note for patient HPI, physical exam, workup.  At this time the plan is follow-up on CT scan and dispo accordingly.  CT scan did not show any acute processes.  I went to talk to the patient and evaluated him and patient voiced that he was upset as to why he did not have an answer.  I spoke to the patient about how we are able to rule out intra-abdominal hemorrhage or other pathologies for his bruising and encouraged him to follow-up with his primary care provider.  Patient given work note as requested.  At time of discharge patient was stable for discharge.  Patient verbalized understanding acceptance of this plan.     Jeremiah Neal 10/16/22 1925    Lorre Nick, MD 10/17/22 [phone]

## 2022-10-16 NOTE — ED Notes (Signed)
Discharge paperwork given and verbally understood. 

## 2022-10-17 NOTE — ED Provider Notes (Signed)
[facility] AT [facility] Provider Note   CSN: [id number] Arrival date & time: 10/16/22  1513     History  Chief Complaint  Patient presents with   Extensive Bruising     Jeremiah Neal is a 52 y.o. male past medical hostory of diverticulitis presents for evaluation of a bruise. He noticed a small bruise under the R rib cage followed by another bruise on his right lower abdomen yesterday. In the last 24 hours the bruised on his abdomen has spread significantly. He denies any abdominal pain, trauma, nausea, vomiting, chest pain or shortness of breath. States he has had a cough in the last 7 days. No fever. Not on any anticoagulants. Not having any hematologic disorder that he knows of.   HPI  Past Medical History:  Diagnosis Date   Diverticulitis    History reviewed. No pertinent surgical history.   Home Medications Prior to Admission medications   Medication Sig Start Date End Date Taking? Authorizing Provider  acetaminophen (TYLENOL) 500 MG tablet Take 500 mg by mouth every 6 (six) hours as needed for mild pain.    [provider]  amLODipine (NORVASC) 10 MG tablet Take 10 mg by mouth daily. 05/29/21   [provider]  aspirin EC 81 MG tablet Take 81 mg by mouth daily as needed for moderate pain. Swallow whole.    [provider]  atorvastatin (LIPITOR) 80 MG tablet Take 80 mg by mouth daily. 06/08/21   [provider]  losartan (COZAAR) 100 MG tablet Take 100 mg by mouth daily. 05/29/21   [provider]  metFORMIN (GLUCOPHAGE) 1000 MG tablet Take 1,000 mg by mouth 2 (two) times daily. 05/27/21   [provider]      Allergies    Penicillins and Penicillins    Review of Systems   Review of Systems Negative except as per HPI.  Physical Exam Updated Vital Signs BP (!) 137/93 (BP Location: Right Arm)   Pulse 90   Temp 98.7 F (37.1 C)   Resp 16   Ht 5\' 10"  (1.778 m)   Wt 107.5 kg   SpO2  94%   BMI 34.01 kg/m  Physical Exam Vitals and nursing note reviewed.  Constitutional:      Appearance: Normal appearance.  HENT:     Head: Normocephalic and atraumatic.     Mouth/Throat:     Mouth: Mucous membranes are moist.  Eyes:     General: No scleral icterus. Cardiovascular:     Rate and Rhythm: Normal rate and regular rhythm.     Pulses: Normal pulses.     Heart sounds: Normal heart sounds.  Pulmonary:     Effort: Pulmonary effort is normal.     Breath sounds: Normal breath sounds.  Abdominal:     General: Abdomen is flat.     Palpations: Abdomen is soft.     Tenderness: There is no abdominal tenderness.     Comments: Bruises as in the picture below.  Musculoskeletal:        General: No deformity.  Skin:    General: Skin is warm.     Findings: No rash.  Neurological:     General: No focal deficit present.     Mental Status: He is alert.  Psychiatric:        Mood and Affect: Mood normal.     ED Results / Procedures / Treatments   Labs (all labs ordered are listed, but only abnormal  results are displayed) Labs Reviewed  CBC WITH DIFFERENTIAL/PLATELET - Abnormal; Notable for the following components:      Result Value   HCT 38.9 (*)    All other components within normal limits  COMPREHENSIVE METABOLIC PANEL - Abnormal; Notable for the following components:   Glucose, Bld 196 (*)    BUN 23 (*)    All other components within normal limits  PROTIME-INR  APTT  LIPASE, BLOOD    EKG None  Radiology CT ABDOMEN PELVIS W CONTRAST  Result Date: 10/16/2022 CLINICAL DATA:  Lower right rib pain, ecchymosis EXAM: CT ABDOMEN AND PELVIS WITH CONTRAST TECHNIQUE: Multidetector CT imaging of the abdomen and pelvis was performed using the standard protocol following bolus administration of intravenous contrast. RADIATION DOSE REDUCTION: This exam was performed according to the departmental dose-optimization program which includes automated exposure control, adjustment of  the mA and/or kV according to patient size and/or use of iterative reconstruction technique. CONTRAST:  OMNIPAQUE IOHEXOL 300 MG/ML  SOLN COMPARISON:  08/24/2017 FINDINGS: Lower chest: No acute abnormality Hepatobiliary: No focal hepatic abnormality. Gallbladder unremarkable. Pancreas: No focal abnormality or ductal dilatation. Spleen: No focal abnormality.  Normal size. Adrenals/Urinary Tract: No adrenal abnormality. No focal renal abnormality. No stones or hydronephrosis. Urinary bladder is unremarkable. Stomach/Bowel: Normal appendix. Stomach, large and small bowel grossly unremarkable. Vascular/Lymphatic: Aortic atherosclerosis. No evidence of aneurysm or adenopathy. Reproductive: No visible focal abnormality. Large right hydrocele again partially visualized, unchanged. Other: No free fluid or free air. Musculoskeletal: No acute bony abnormality. No visible lower rib fracture. IMPRESSION: No acute findings in the abdomen or pelvis. No visible lower rib fracture or pleural effusion. Aortic atherosclerosis. Electronically Signed   By: Charlett Nose M.D.   On: 10/16/2022 19:07   DG Chest 2 View  Result Date: 10/16/2022 CLINICAL DATA:  Right chest wall pain and bruising for 1 week. No known injury. EXAM: CHEST - 2 VIEW COMPARISON:  06/11/2021 FINDINGS: The heart size and mediastinal contours are within normal limits. Both lungs are clear. The visualized skeletal structures are unremarkable. IMPRESSION: No active cardiopulmonary disease. Electronically Signed   By: Danae Orleans M.D.   On: 10/16/2022 17:57    Procedures Procedures    Medications Ordered in ED Medications  iohexol (OMNIPAQUE) 300 MG/ML solution 100 mL (100 mLs Intravenous Contrast Given 10/16/22 1720)    ED Course/ Medical Decision Making/ A&P                                 Medical Decision Making Amount and/or Complexity of Data Reviewed Labs: ordered. Radiology: ordered.  Risk Prescription drug management.   This  patient presents to the ED for constusion, this involves an extensive number of treatment options, and is a complaint that carries with a high risk of complications and morbidity.  The differential diagnosis includes trauma, thrombocytopenia, hemophilia, liver disorder, anticoagulants.  This is not an exhaustive list.  Lab tests: I ordered and personally interpreted labs.  The pertinent results include: WBC unremarkable. Hbg unremarkable. Platelets unremarkable. Electrolytes unremarkable. BUN, creatinine unremarkable.  Lipase, APTT, PT/INR are normal.  Imaging studies: Chest x-ray and CT abdomen pelvis scan ordered and pending.  Problem list/ ED course/ Critical interventions/ Medical management: HPI: See above Vital signs within normal range and stable throughout visit. Laboratory/imaging studies significant for: See above. On physical examination, patient is afebrile and appears in no acute distress.  There is contusion noted to  right lower quadrant that is spread significantly in the last 24 hours.  Labs are nonrevealing.  Patient with no known hematologic disorder.  He is not on any anticoagulants.  Abdomen is nontender.  CT abdomen pelvis ordered and pending.  Patient is hemodynamically stable at this point with normal blood pressure.  No evidence of anemia on CBC.  CT abdomen pelvis ordered and pending. I have reviewed the patient home medicines and have made adjustments as needed.  Cardiac monitoring/EKG: The patient was maintained on a cardiac monitor.  I personally reviewed and interpreted the cardiac monitor which showed an underlying rhythm of: sinus rhythm.  Additional history obtained: External records from outside source obtained and reviewed including: Chart review including previous notes, labs, imaging.  Consultations obtained:  Disposition Patient's care signed out to change to Vaughn, PA-C with pending CT scan abdomen pelvis. This chart was dictated using voice  recognition software.  Despite best efforts to proofread,  errors can occur which can change the documentation meaning.          Final Clinical Impression(s) / ED Diagnoses Final diagnoses:  Contusion of abdominal wall, initial encounter    Rx / DC Orders ED Discharge Orders     None         Jeanelle Malling, [place] 10/17/22 1027    Lorre Nick, MD 10/17/22 [phone]

## 2022-10-21 ENCOUNTER — Encounter (HOSPITAL_BASED_OUTPATIENT_CLINIC_OR_DEPARTMENT_OTHER): Payer: Self-pay

## 2022-11-14 DIAGNOSIS — Z419 Encounter for procedure for purposes other than remedying health state, unspecified: Secondary | ICD-10-CM | POA: Diagnosis not present

## 2022-12-14 DIAGNOSIS — Z419 Encounter for procedure for purposes other than remedying health state, unspecified: Secondary | ICD-10-CM | POA: Diagnosis not present

## 2022-12-17 IMAGING — CT CT HEAD W/O CM
4 series · 16 of 47 positions shown, 18 images · non-contrast
Comparison: None Available.

CLINICAL DATA: Headache, new or worsening (Age >= 50y)



[Series 2: head without · axial · non-contrast · 0.46mm/px · z∈[-48,+77]mm · 7 of 35 slices shown, 9 images]
[im 5/35  brain]
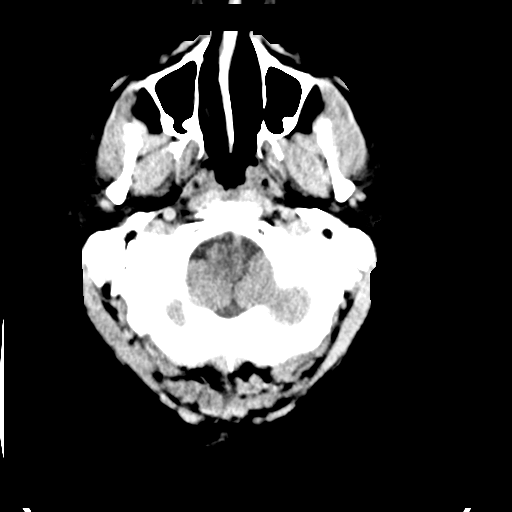
[im 5/35  bone]
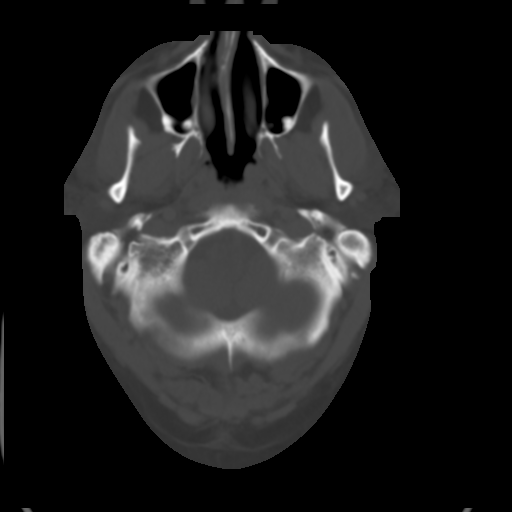
[im 9/35  brain]
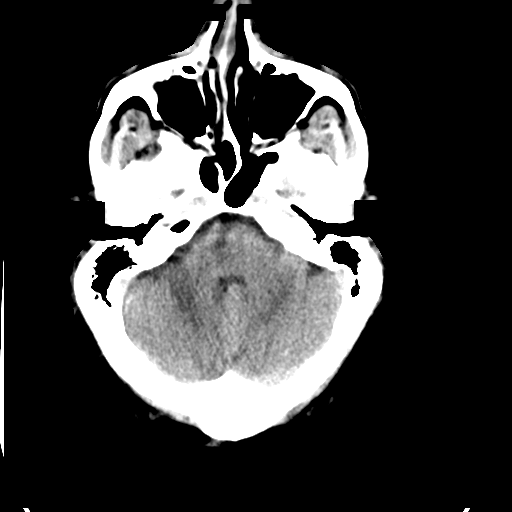
[im 13/35  brain]
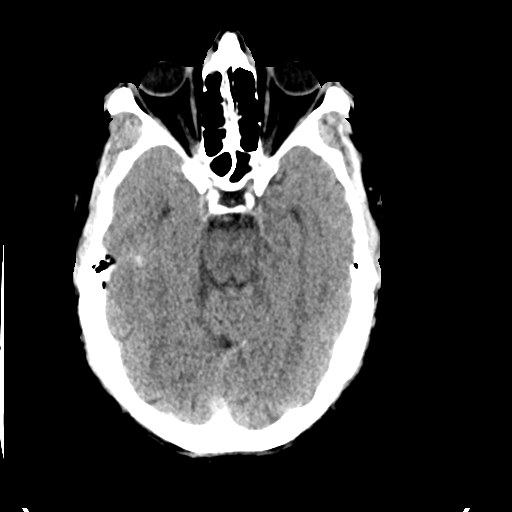
[im 18/35  brain]
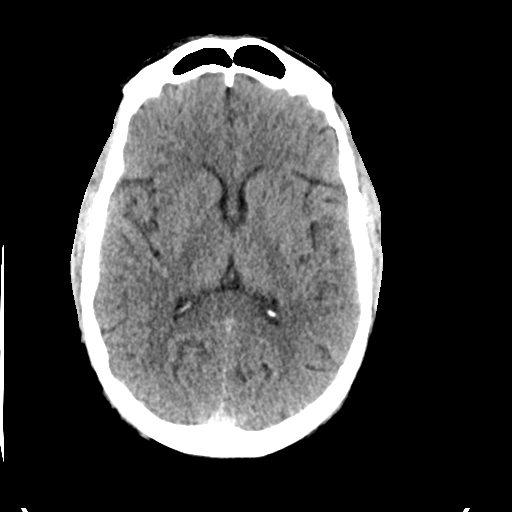
[im 22/35  brain]
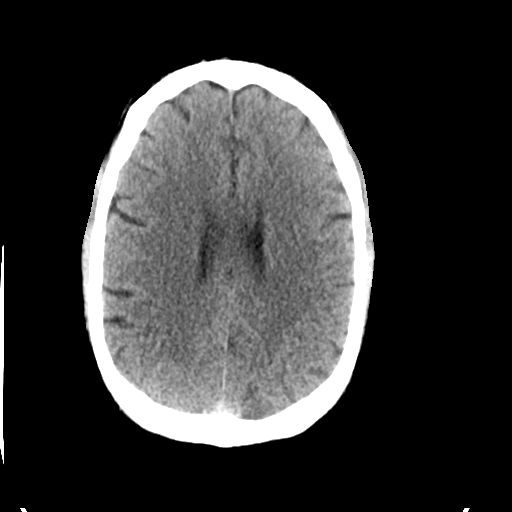
[im 22/35  bone]
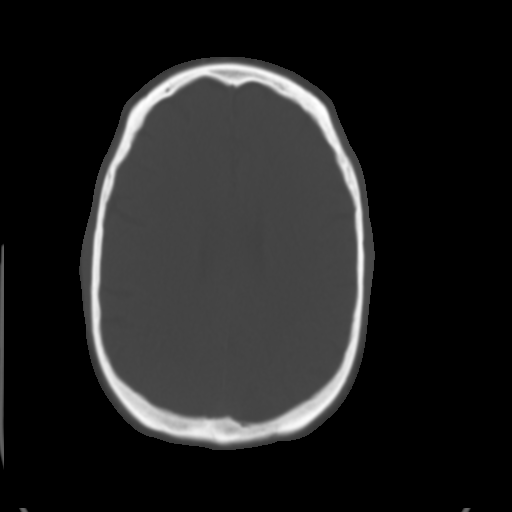
[im 26/35  brain]
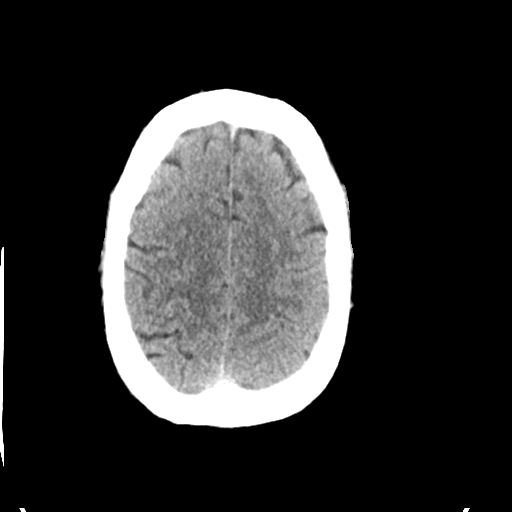
[im 30/35  brain]
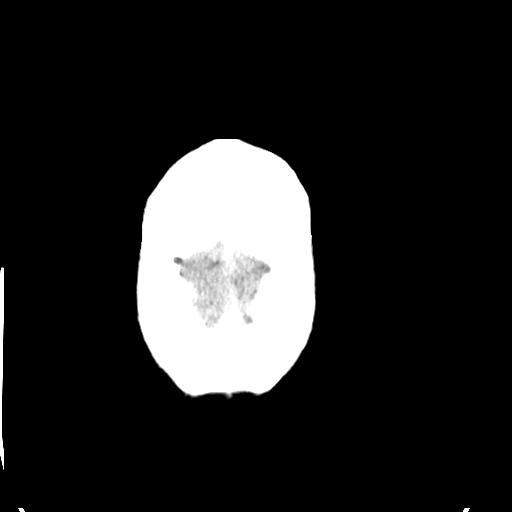

[Series 3: head bone · axial · 0.46mm/px · z∈[-52,-18]mm · 3 of 87 slices shown]
[im 9/87  bone]
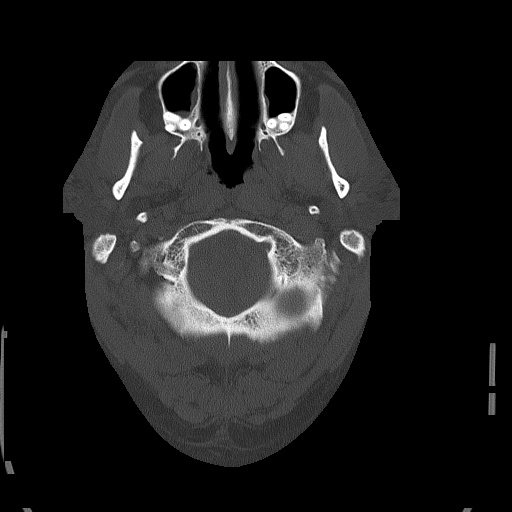
[im 18/87  bone]
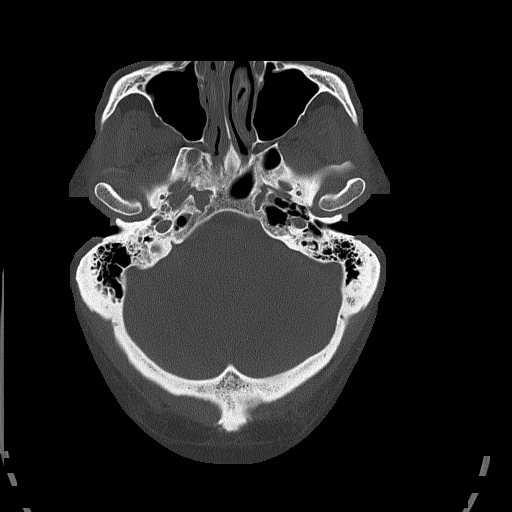
[im 26/87  bone]
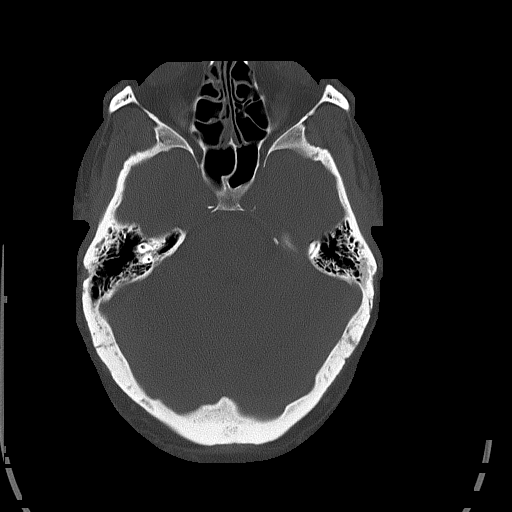

[Series 4: head without cor · coronal · non-contrast · 0.38mm/px · 3 of 71 slices shown]
[im 24/71  brain]
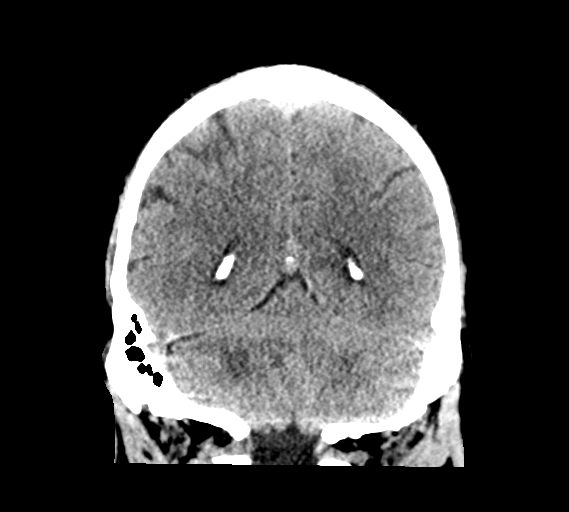
[im 32/71  brain]
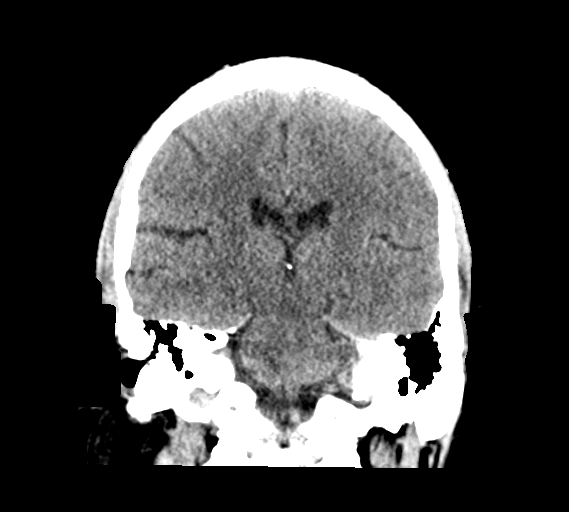
[im 39/71  brain]
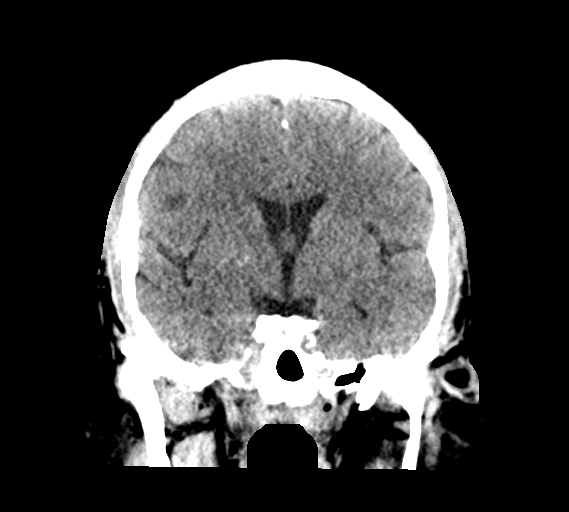

[Series 5: head without sag · sagittal · non-contrast · 0.34mm/px · 3 of 58 slices shown]
[im 20/58  brain]
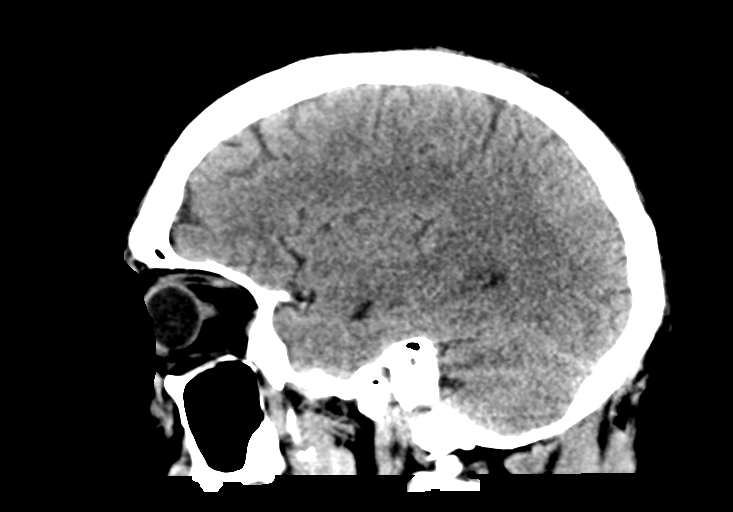
[im 29/58  brain]
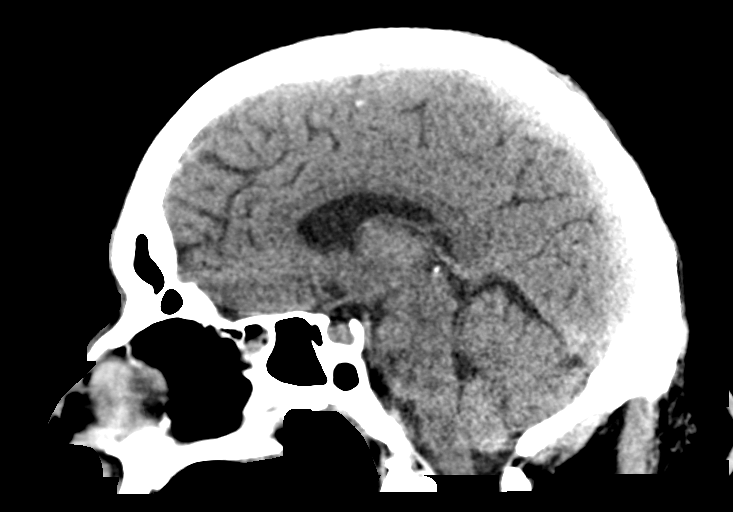
[im 39/58  brain]
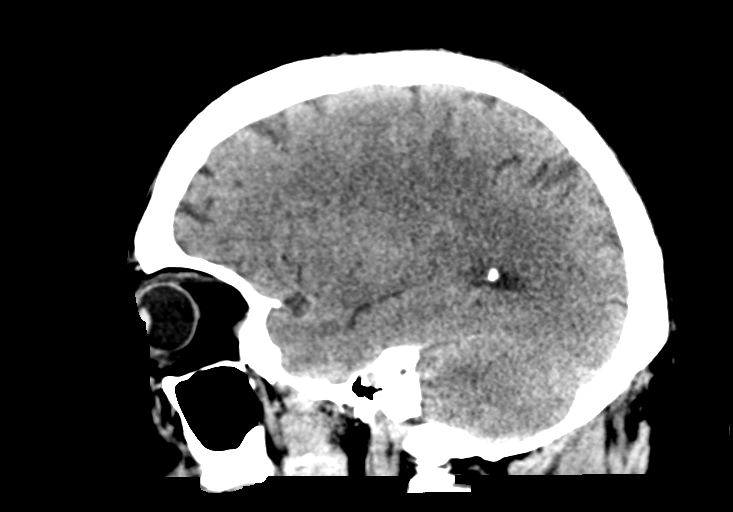

[16 of 47 positions shown; findings below may reference images not displayed]

FINDINGS: Brain: No evidence of acute infarction, hemorrhage, hydrocephalus,
extra-axial collection or mass lesion/mass effect.

Vascular: No hyperdense vessel identified.

Skull: No acute fracture.

Sinuses/Orbits: Mild paranasal sinus mucosal thickening. No acute
orbital findings.

Other: No mastoid effusions.
IMPRESSION: No evidence of acute intracranial abnormality.

## 2022-12-17 IMAGING — DX DG CHEST 2V
2 series · 2 of 2 positions shown · non-contrast
Comparison: Chest radiograph 08/24/2017.

CLINICAL DATA: Chest pain.

EXAM:
CHEST - 2 VIEW

[chest pa]
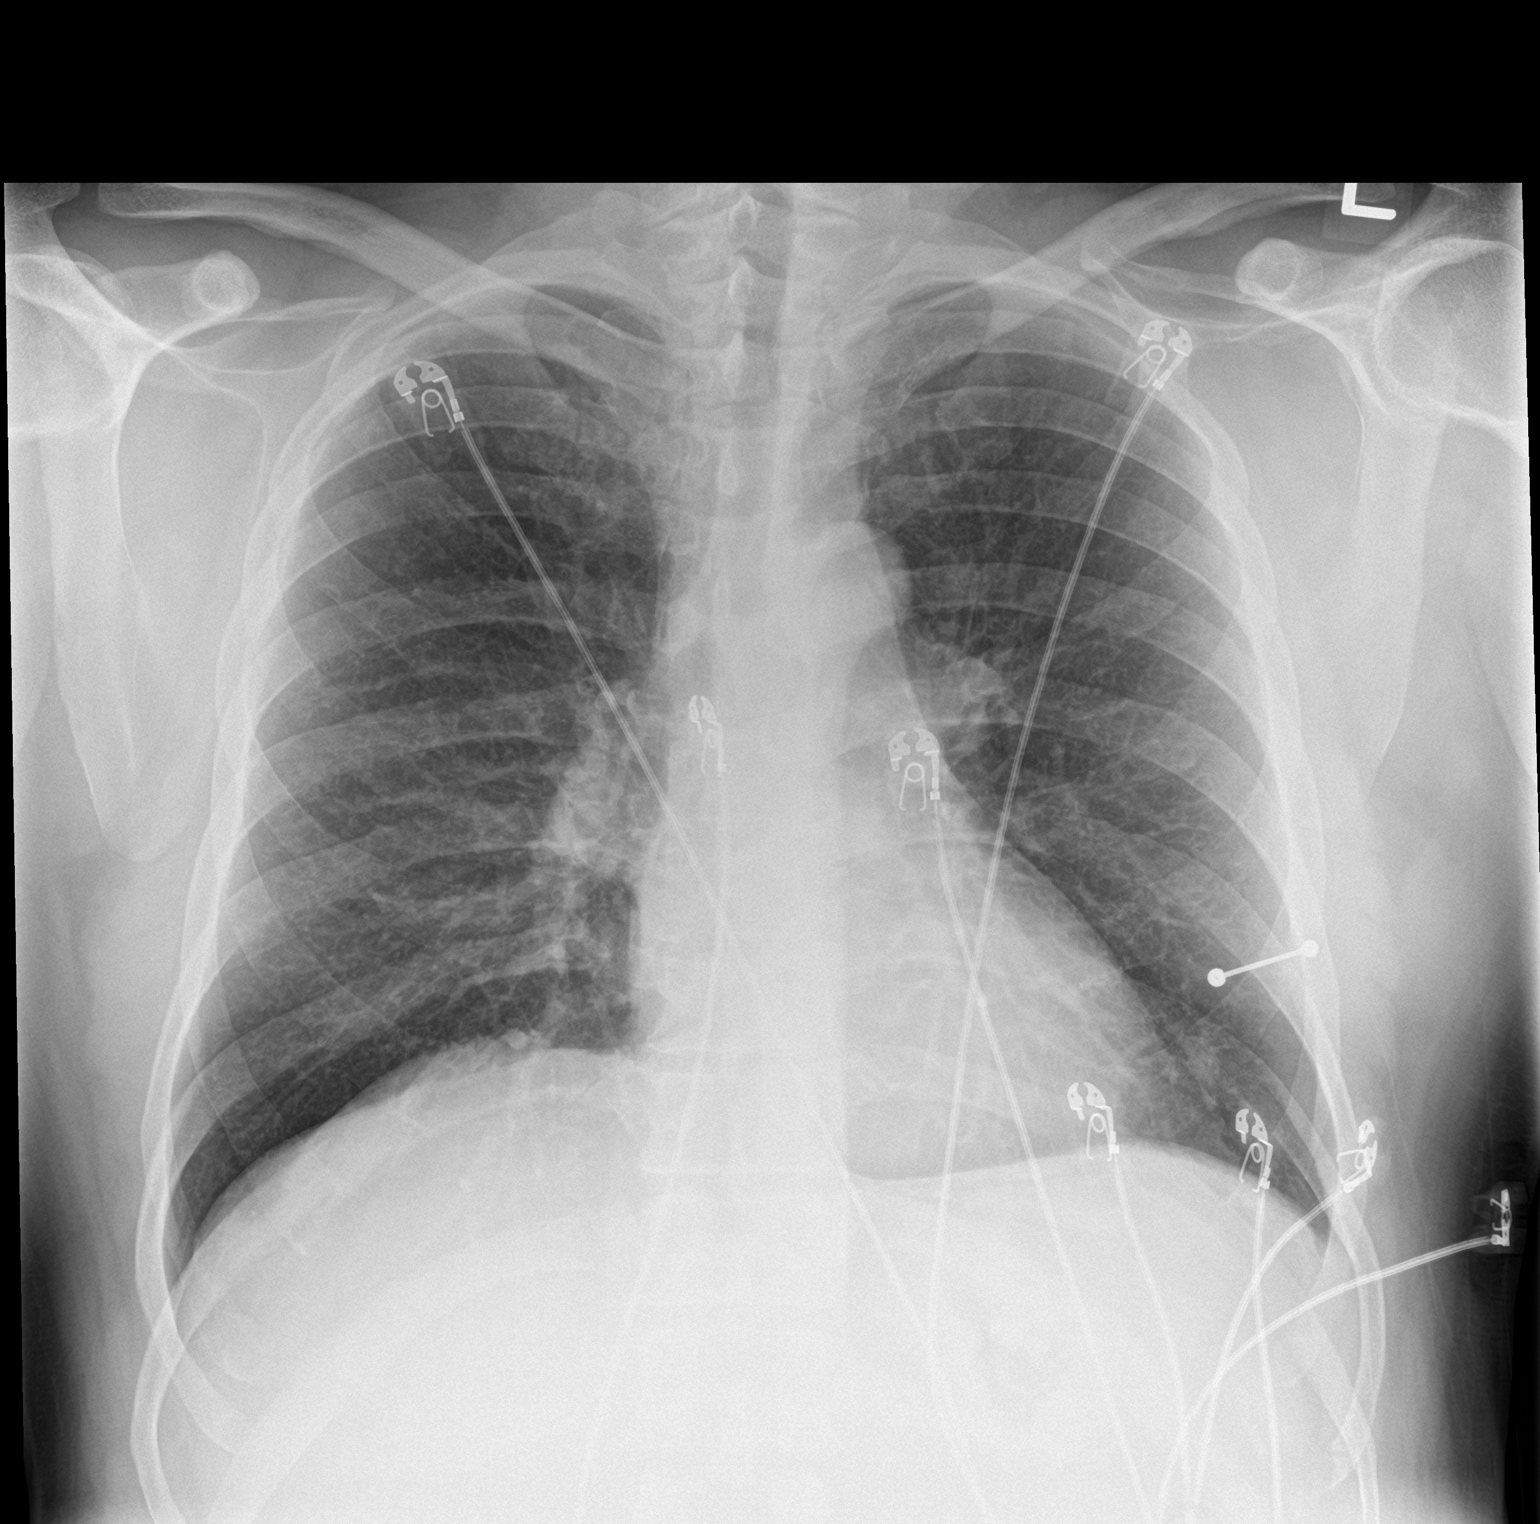

[chest lat]
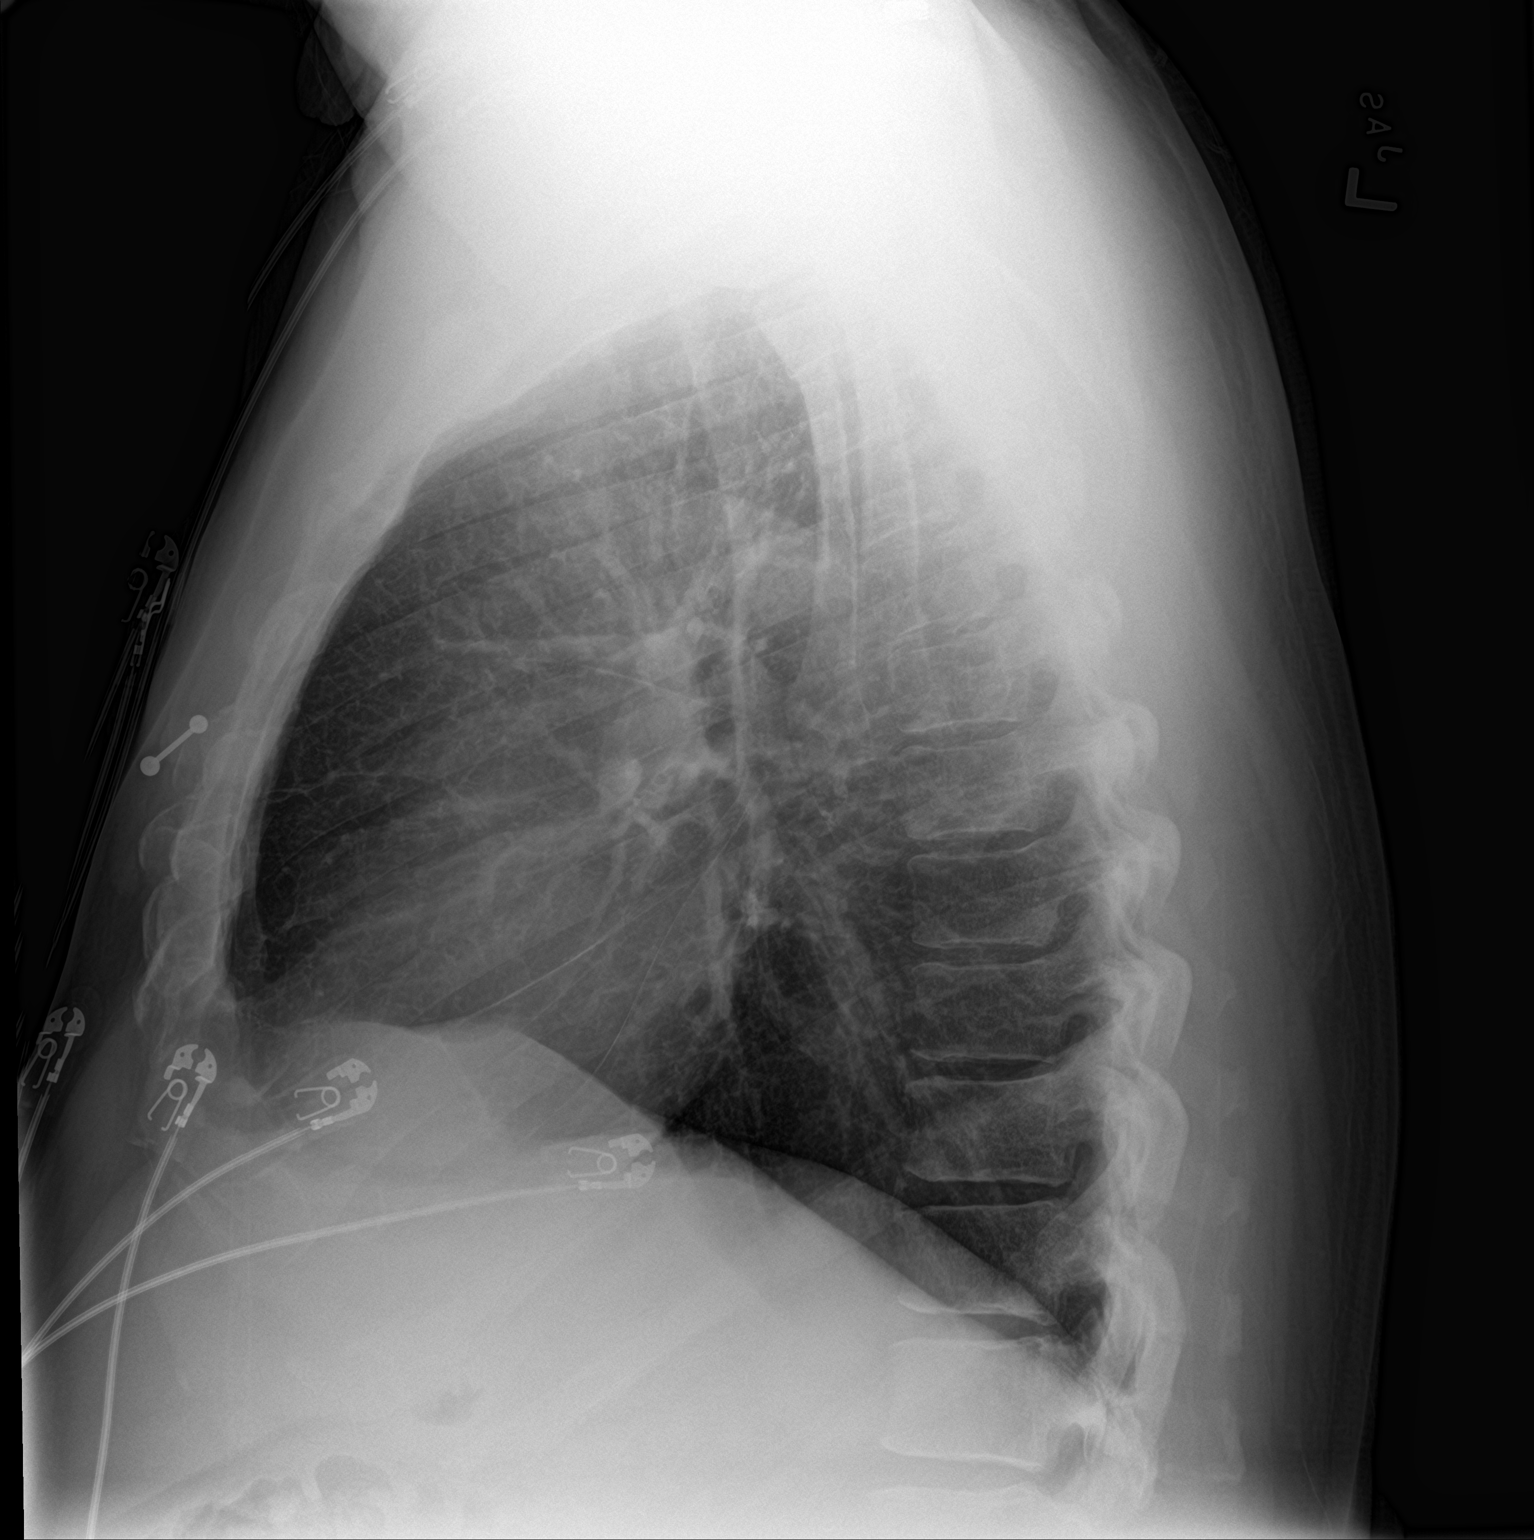

[2 of 2 positions shown; findings below may reference images not displayed]

FINDINGS: No consolidation. No visible pleural effusions or pneumothorax.
Cardiomediastinal silhouette is within normal limits. No displaced
fracture.
IMPRESSION: No evidence of acute cardiopulmonary disease.

## 2023-01-14 DIAGNOSIS — Z419 Encounter for procedure for purposes other than remedying health state, unspecified: Secondary | ICD-10-CM | POA: Diagnosis not present

## 2023-02-14 DIAGNOSIS — Z419 Encounter for procedure for purposes other than remedying health state, unspecified: Secondary | ICD-10-CM | POA: Diagnosis not present

## 2023-03-14 DIAGNOSIS — Z419 Encounter for procedure for purposes other than remedying health state, unspecified: Secondary | ICD-10-CM | POA: Diagnosis not present

## 2023-04-06 ENCOUNTER — Encounter: Payer: Self-pay | Admitting: Family Medicine

## 2023-04-06 ENCOUNTER — Ambulatory Visit (INDEPENDENT_AMBULATORY_CARE_PROVIDER_SITE_OTHER): Payer: Medicaid Other | Admitting: Family Medicine

## 2023-04-06 VITALS — BP 140/88 | HR 93 | Temp 98.6°F | Ht 70.0 in | Wt 245.0 lb

## 2023-04-06 DIAGNOSIS — Z794 Long term (current) use of insulin: Secondary | ICD-10-CM

## 2023-04-06 DIAGNOSIS — I152 Hypertension secondary to endocrine disorders: Secondary | ICD-10-CM

## 2023-04-06 DIAGNOSIS — E1165 Type 2 diabetes mellitus with hyperglycemia: Secondary | ICD-10-CM | POA: Diagnosis not present

## 2023-04-06 DIAGNOSIS — E785 Hyperlipidemia, unspecified: Secondary | ICD-10-CM | POA: Insufficient documentation

## 2023-04-06 DIAGNOSIS — Z0001 Encounter for general adult medical examination with abnormal findings: Secondary | ICD-10-CM

## 2023-04-06 DIAGNOSIS — E1159 Type 2 diabetes mellitus with other circulatory complications: Secondary | ICD-10-CM

## 2023-04-06 DIAGNOSIS — I10 Essential (primary) hypertension: Secondary | ICD-10-CM | POA: Insufficient documentation

## 2023-04-06 DIAGNOSIS — Z0000 Encounter for general adult medical examination without abnormal findings: Secondary | ICD-10-CM | POA: Insufficient documentation

## 2023-04-06 DIAGNOSIS — Z1159 Encounter for screening for other viral diseases: Secondary | ICD-10-CM

## 2023-04-06 DIAGNOSIS — G5603 Carpal tunnel syndrome, bilateral upper limbs: Secondary | ICD-10-CM | POA: Diagnosis not present

## 2023-04-06 DIAGNOSIS — I700 Atherosclerosis of aorta: Secondary | ICD-10-CM

## 2023-04-06 DIAGNOSIS — E1169 Type 2 diabetes mellitus with other specified complication: Secondary | ICD-10-CM | POA: Diagnosis not present

## 2023-04-06 DIAGNOSIS — E118 Type 2 diabetes mellitus with unspecified complications: Secondary | ICD-10-CM | POA: Insufficient documentation

## 2023-04-06 MED ORDER — METFORMIN HCL 1000 MG PO TABS: 1000.0000 mg | ORAL_TABLET | Freq: Two times a day (BID) | ORAL | 1 refills | Status: DC

## 2023-04-06 MED ORDER — LOSARTAN POTASSIUM 100 MG PO TABS: 100.0000 mg | ORAL_TABLET | Freq: Every day | ORAL | 1 refills | Status: DC

## 2023-04-06 MED ORDER — ATORVASTATIN CALCIUM 80 MG PO TABS
80.0000 mg | ORAL_TABLET | Freq: Every day | ORAL | 1 refills | Status: DC
Start: 2023-04-06 — End: 2023-08-21

## 2023-04-06 MED ORDER — AMLODIPINE BESYLATE 10 MG PO TABS: 10.0000 mg | ORAL_TABLET | Freq: Every day | ORAL | 1 refills | Status: DC

## 2023-04-06 MED ORDER — BASAGLAR KWIKPEN 100 UNIT/ML ~~LOC~~ SOPN
65.0000 [IU] | PEN_INJECTOR | Freq: Every day | SUBCUTANEOUS | 3 refills | Status: DC
Start: 2023-04-06 — End: 2023-05-13

## 2023-04-06 NOTE — Patient Instructions (Signed)
 It was great to meet you today and I'm excited to have you join the [facility] practice. I hope you had a positive experience today! If you feel so inclined, please feel free to recommend our practice to friends and family. Kurtis Bushman, FNP-C

## 2023-04-06 NOTE — Assessment & Plan Note (Signed)
 Discussed importance of management of comorbid conditions and risks of CAD.

## 2023-04-06 NOTE — Assessment & Plan Note (Signed)
 Continue Basaglar 65 units daily and Metformin 1000mg  BID. Discussed reducing insuling burden with GLP/GIP.  A1c and uACR today. Foot exam UTD. Vaccines declines. Retinal eye exam declines. Recommended monitoring BG BID. Recommend heart healthy diet such as Mediterranean diet with whole grains, fruits, vegetable, fish, lean meats, nuts, and olive oil. Limit salt. Encouraged moderate walking, 3-5 times/week for 30-50 minutes each session. Aim for at least 150 minutes.week. Goal should be pace of 3 miles/hours, or walking 1.5 miles in 30 minutes. Seek medical care for urinary frequency, extreme thirst, vision changes, lightheadedness, dizziness.  Follow up in 2-4 weeks.

## 2023-04-06 NOTE — Assessment & Plan Note (Signed)
 Uncontrolled. Continue Amlodipine 10mg  daily, restart Losartan 100mg  daily. Recommend heart healthy diet such as Mediterranean diet with whole grains, fruits, vegetable, fish, lean meats, nuts, and olive oil. Limit salt. Encouraged moderate walking, 3-5 times/week for 30-50 minutes each session. Aim for at least 150 minutes.week. Goal should be pace of 3 miles/hours, or walking 1.5 miles in 30 minutes. Avoid tobacco products. Avoid excess alcohol. Take medications as prescribed and bring medications and blood pressure log with cuff to each office visit. Seek medical care for chest pain, palpitations, shortness of breath with exertion, dizziness/lightheadedness, vision changes, recurrent headaches, or swelling of extremities. Follow up in 2-4 weeks.

## 2023-04-06 NOTE — Assessment & Plan Note (Signed)
Today your medical history was reviewed and routine physical exam with labs was performed. Recommend 150 minutes of moderate intensity exercise weekly and consuming a well-balanced diet. Advised to stop smoking if a smoker, avoid smoking if a non-smoker, limit alcohol consumption to 1 drink per day for women and 2 drinks per day for men, and avoid illicit drug use. Counseled on the importance of sunscreen use. Counseled in mental health awareness and when to seek medical care. Vaccine maintenance discussed. Appropriate health maintenance items reviewed. Return to office in 1 year for annual physical exam.

## 2023-04-06 NOTE — Assessment & Plan Note (Signed)
 Fasting labs today. Continue Atorvastatin 80mg  daily. Your labs showed elevated cholesterol. I recommend consuming a heart healthy diet such as Mediterranean diet or DASH diet with whole grains, fruits, vegetable, fish, lean meats, nuts, and olive oil. Limit sweets and processed foods. I also encourage moderate intensity exercise 150 minutes weekly. This is 3-5 times weekly for 30-50 minutes each session. Goal should be pace of 3 miles/hours, or walking 1.5 miles in 30 minutes. The ASCVD Risk score (Arnett DK, et al., 2019) failed to calculate for the following reasons:   Cannot find a previous HDL lab   Cannot find a previous total cholesterol lab

## 2023-04-06 NOTE — Assessment & Plan Note (Signed)
 Counseled on importance of weight management for overall health. Encouraged low calorie, heart healthy diet and moderate intensity exercise 150 minutes weekly. This is 3-5 times weekly for 30-50 minutes each session. Goal should be pace of 3 miles/hours, or walking 1.5 miles in 30 minutes and include strength training. Discussed risks of obesity.

## 2023-04-06 NOTE — Progress Notes (Signed)
 New Patient Office Visit  Subjective    Patient ID: Jeremiah Neal, male    DOB: 30-Sep-1970  Age: 53 y.o. MRN: [id number]  CC:  Chief Complaint  Patient presents with   Establish Care    HPI Jeremiah Neal presents to establish care. Oriented to practice routines and expectations. PMH includes HTN, DM2, HLD, diverticulitis. Has been seeing a PCP regularly up until office closed several months ago, unable to obtain records. He has been out of his Losartan for 1 month and is taking Metformin once daily. [name] has uncontrolled DM2, last A1c was 9%. Has tried Trulicity in past, this medication was ineffective for lowering his A1c. No personal history of pancreatitis, or personal or family history of MEN2 or MTC. Concerns include medication refills.  Colon CA screening: refuses Tobacco: smoker  (1 ppd x 30 yrs) STI: declines Vaccines:  declines  DIABETES Hypoglycemic episodes:no Polydipsia/polyuria: no Visual disturbance: no Chest pain: no Paresthesias: no Glucose Monitoring: no  Accucheck frequency: Not Checking  Fasting glucose:  Post prandial:  Evening:  Before meals: Taking Insulin?: yes  Long acting insulin: 65 units daily  Short acting insulin: Blood Pressure Monitoring: not checking Retinal Examination: Not up to Date Foot Exam: Not up to Date Diabetic Education: Completed Pneumovax: Not up to Date Influenza: Not up to Date Aspirin: yes    Outpatient Encounter Medications as of 04/06/2023  Medication Sig   acetaminophen (TYLENOL) 500 MG tablet Take 500 mg by mouth every 6 (six) hours as needed for mild pain.   aspirin EC 81 MG tablet Take 81 mg by mouth daily as needed for moderate pain. Swallow whole.   [DISCONTINUED] amLODipine (NORVASC) 10 MG tablet Take 10 mg by mouth daily.   [DISCONTINUED] atorvastatin (LIPITOR) 80 MG tablet Take 80 mg by mouth daily.   [DISCONTINUED] Insulin Glargine (BASAGLAR KWIKPEN) 100 UNIT/ML Inject 65 Units into the skin daily.    [DISCONTINUED] losartan (COZAAR) 100 MG tablet Take 100 mg by mouth daily.   [DISCONTINUED] metFORMIN (GLUCOPHAGE) 1000 MG tablet Take 1,000 mg by mouth 2 (two) times daily.   amLODipine (NORVASC) 10 MG tablet Take 1 tablet (10 mg total) by mouth daily.   atorvastatin (LIPITOR) 80 MG tablet Take 1 tablet (80 mg total) by mouth daily.   Insulin Glargine (BASAGLAR KWIKPEN) 100 UNIT/ML Inject 65 Units into the skin daily.   losartan (COZAAR) 100 MG tablet Take 1 tablet (100 mg total) by mouth daily.   metFORMIN (GLUCOPHAGE) 1000 MG tablet Take 1 tablet (1,000 mg total) by mouth 2 (two) times daily with a meal.   No facility-administered encounter medications on file as of 04/06/2023.    Past Medical History:  Diagnosis Date   Diabetes mellitus without complication (HCC)    type 2   Diverticulitis    Hyperlipidemia    Hypertension     History reviewed. No pertinent surgical history.  History reviewed. No pertinent family history.  Social History   Socioeconomic History   Marital status: Single    Spouse name: Not on file   Number of children: Not on file   Years of education: Not on file   Highest education level: Not on file  Occupational History   Not on file  Tobacco Use   Smoking status: Every Day    Current packs/day: 1.00    Types: Cigarettes   Smokeless tobacco: Never  Substance and Sexual Activity   Alcohol use: Yes    Comment: occasionally  Drug use: Not Currently    Types: Marijuana   Sexual activity: Not on file  Other Topics Concern   Not on file  Social History Narrative   ** Merged History Encounter **       Social Drivers of Health   Financial Resource Strain: Not on file  Food Insecurity: Not on file  Transportation Needs: Not on file  Physical Activity: Not on file  Stress: Not on file  Social Connections: Not on file  Intimate Partner Violence: Not on file    Review of Systems  Constitutional: Negative.   HENT: Negative.    Eyes:  Negative.   Respiratory:  Positive for cough.   Cardiovascular: Negative.   Gastrointestinal: Negative.   Genitourinary: Negative.   Musculoskeletal: Negative.   Skin: Negative.   Neurological: Negative.   Endo/Heme/Allergies: Negative.   Psychiatric/Behavioral:  The patient is nervous/anxious.   All other systems reviewed and are negative.       Objective    BP (!) 140/88   Pulse 93   Temp 98.6 F (37 C)   Ht 5\' 10"  (1.778 m)   Wt 245 lb (111.1 kg)   SpO2 97%   BMI 35.15 kg/m   Physical Exam Vitals and nursing note reviewed.  Constitutional:      Appearance: Normal appearance. He is obese.  HENT:     Head: Normocephalic and atraumatic.     Right Ear: Tympanic membrane, ear canal and external ear normal.     Left Ear: Tympanic membrane, ear canal and external ear normal.     Nose: Nose normal.     Mouth/Throat:     Mouth: Mucous membranes are moist.     Pharynx: Oropharynx is clear.  Eyes:     Extraocular Movements: Extraocular movements intact.     Right eye: Normal extraocular motion and no nystagmus.     Left eye: Normal extraocular motion and no nystagmus.     Conjunctiva/sclera: Conjunctivae normal.     Pupils: Pupils are equal, round, and reactive to light.  Cardiovascular:     Rate and Rhythm: Normal rate and regular rhythm.     Pulses: Normal pulses.     Heart sounds: Normal heart sounds.  Pulmonary:     Effort: Pulmonary effort is normal.     Breath sounds: Normal breath sounds.  Abdominal:     General: Bowel sounds are normal.     Palpations: Abdomen is soft.  Genitourinary:    Comments: Deferred using shared decision making Musculoskeletal:        General: Normal range of motion.     Cervical back: Normal range of motion and neck supple.  Skin:    General: Skin is warm and dry.     Capillary Refill: Capillary refill takes less than 2 seconds.  Neurological:     General: No focal deficit present.     Mental Status: He is alert. Mental status  is at baseline.  Psychiatric:        Mood and Affect: Mood normal.        Speech: Speech normal.        Behavior: Behavior normal.        Thought Content: Thought content normal.        Cognition and Memory: Cognition and memory normal.        Judgment: Judgment normal.         Assessment & Plan:   Problem List Items Addressed This Visit  Aortic atherosclerosis (HCC)   Discussed importance of management of comorbid conditions and risks of CAD.      Relevant Medications   amLODipine (NORVASC) 10 MG tablet   atorvastatin (LIPITOR) 80 MG tablet   losartan (COZAAR) 100 MG tablet   Hypertension associated with diabetes (HCC)   Uncontrolled. Continue Amlodipine 10mg  daily, restart Losartan 100mg  daily. Recommend heart healthy diet such as Mediterranean diet with whole grains, fruits, vegetable, fish, lean meats, nuts, and olive oil. Limit salt. Encouraged moderate walking, 3-5 times/week for 30-50 minutes each session. Aim for at least 150 minutes.week. Goal should be pace of 3 miles/hours, or walking 1.5 miles in 30 minutes. Avoid tobacco products. Avoid excess alcohol. Take medications as prescribed and bring medications and blood pressure log with cuff to each office visit. Seek medical care for chest pain, palpitations, shortness of breath with exertion, dizziness/lightheadedness, vision changes, recurrent headaches, or swelling of extremities. Follow up in 2-4 weeks.       Relevant Medications   amLODipine (NORVASC) 10 MG tablet   atorvastatin (LIPITOR) 80 MG tablet   Insulin Glargine (BASAGLAR KWIKPEN) 100 UNIT/ML   losartan (COZAAR) 100 MG tablet   metFORMIN (GLUCOPHAGE) 1000 MG tablet   Other Relevant Orders   CBC with Differential/Platelet   COMPLETE METABOLIC PANEL WITH GFR   Lipid panel   Hemoglobin A1c   Microalbumin / creatinine urine ratio   Vitamin B12   Hyperlipidemia associated with type 2 diabetes mellitus (HCC)   Fasting labs today. Continue Atorvastatin  80mg  daily. Your labs showed elevated cholesterol. I recommend consuming a heart healthy diet such as Mediterranean diet or DASH diet with whole grains, fruits, vegetable, fish, lean meats, nuts, and olive oil. Limit sweets and processed foods. I also encourage moderate intensity exercise 150 minutes weekly. This is 3-5 times weekly for 30-50 minutes each session. Goal should be pace of 3 miles/hours, or walking 1.5 miles in 30 minutes. The ASCVD Risk score (Arnett DK, et al., 2019) failed to calculate for the following reasons:   Cannot find a previous HDL lab   Cannot find a previous total cholesterol lab       Relevant Medications   amLODipine (NORVASC) 10 MG tablet   atorvastatin (LIPITOR) 80 MG tablet   Insulin Glargine (BASAGLAR KWIKPEN) 100 UNIT/ML   losartan (COZAAR) 100 MG tablet   metFORMIN (GLUCOPHAGE) 1000 MG tablet   Other Relevant Orders   Lipid panel   Controlled type 2 diabetes mellitus with hyperglycemia, with long-term current use of insulin (HCC)   Continue Basaglar 65 units daily and Metformin 1000mg  BID. Discussed reducing insuling burden with GLP/GIP.  A1c and uACR today. Foot exam UTD. Vaccines declines. Retinal eye exam declines. Recommended monitoring BG BID. Recommend heart healthy diet such as Mediterranean diet with whole grains, fruits, vegetable, fish, lean meats, nuts, and olive oil. Limit salt. Encouraged moderate walking, 3-5 times/week for 30-50 minutes each session. Aim for at least 150 minutes.week. Goal should be pace of 3 miles/hours, or walking 1.5 miles in 30 minutes. Seek medical care for urinary frequency, extreme thirst, vision changes, lightheadedness, dizziness.  Follow up in 2-4 weeks.       Relevant Medications   atorvastatin (LIPITOR) 80 MG tablet   Insulin Glargine (BASAGLAR KWIKPEN) 100 UNIT/ML   losartan (COZAAR) 100 MG tablet   metFORMIN (GLUCOPHAGE) 1000 MG tablet   Other Relevant Orders   CBC with Differential/Platelet   COMPLETE  METABOLIC PANEL WITH GFR   Lipid panel  Hemoglobin A1c   Microalbumin / creatinine urine ratio   Vitamin B12   Morbid obesity (HCC)   Counseled on importance of weight management for overall health. Encouraged low calorie, heart healthy diet and moderate intensity exercise 150 minutes weekly. This is 3-5 times weekly for 30-50 minutes each session. Goal should be pace of 3 miles/hours, or walking 1.5 miles in 30 minutes and include strength training. Discussed risks of obesity.       Relevant Medications   Insulin Glargine (BASAGLAR KWIKPEN) 100 UNIT/ML   metFORMIN (GLUCOPHAGE) 1000 MG tablet   Physical exam, annual - Primary   Today your medical history was reviewed and routine physical exam with labs was performed. Recommend 150 minutes of moderate intensity exercise weekly and consuming a well-balanced diet. Advised to stop smoking if a smoker, avoid smoking if a non-smoker, limit alcohol consumption to 1 drink per day for women and 2 drinks per day for men, and avoid illicit drug use. Counseled on the importance of sunscreen use. Counseled in mental health awareness and when to seek medical care. Vaccine maintenance discussed. Appropriate health maintenance items reviewed. Return to office in 1 year for annual physical exam.       Other Visit Diagnoses       Need for hepatitis C screening test       Relevant Orders   Hepatitis C antibody       Return in about 4 weeks (around 05/04/2023) for hypertension.   Park Meo, FNP

## 2023-04-07 ENCOUNTER — Other Ambulatory Visit: Payer: Self-pay | Admitting: Family Medicine

## 2023-04-07 ENCOUNTER — Encounter: Payer: Self-pay | Admitting: Family Medicine

## 2023-04-07 DIAGNOSIS — E1159 Type 2 diabetes mellitus with other circulatory complications: Secondary | ICD-10-CM

## 2023-04-07 DIAGNOSIS — E1169 Type 2 diabetes mellitus with other specified complication: Secondary | ICD-10-CM

## 2023-04-07 DIAGNOSIS — E1165 Type 2 diabetes mellitus with hyperglycemia: Secondary | ICD-10-CM

## 2023-04-07 LAB — CBC WITH DIFFERENTIAL/PLATELET
Absolute Lymphocytes: 1961 {cells}/uL (ref 850–3900)
Absolute Monocytes: 644 {cells}/uL (ref 200–950)
Basophils Absolute: 37 {cells}/uL (ref 0–200)
Basophils Relative: 0.5 %
Eosinophils Absolute: 252 {cells}/uL (ref 15–500)
Eosinophils Relative: 3.4 %
HCT: 46.1 % (ref 38.5–50.0)
Hemoglobin: 14.8 g/dL (ref 13.2–17.1)
MCH: 26.8 pg — ABNORMAL LOW (ref 27.0–33.0)
MCHC: 32.1 g/dL (ref 32.0–36.0)
MCV: 83.5 fL (ref 80.0–100.0)
MPV: 10.1 fL (ref 7.5–12.5)
Monocytes Relative: 8.7 %
Neutro Abs: 4507 {cells}/uL (ref 1500–7800)
Neutrophils Relative %: 60.9 %
Platelets: 350 10*3/uL (ref 140–400)
RBC: 5.52 10*6/uL (ref 4.20–5.80)
RDW: 14.4 % (ref 11.0–15.0)
Total Lymphocyte: 26.5 %
WBC: 7.4 10*3/uL (ref 3.8–10.8)

## 2023-04-07 LAB — LIPID PANEL
Cholesterol: 189 mg/dL (ref <200)
HDL: 40 mg/dL (ref >=40)
LDL Cholesterol (Calc): 126 mg/dL — ABNORMAL HIGH
Non-HDL Cholesterol (Calc): 149 mg/dL — ABNORMAL HIGH (ref <130)
Total CHOL/HDL Ratio: 4.7 (calc) (ref <5.0)
Triglycerides: 120 mg/dL (ref <150)

## 2023-04-07 LAB — COMPLETE METABOLIC PANEL WITH GFR
AG Ratio: 1.6 (calc) (ref 1.0–2.5)
ALT: 20 U/L (ref 9–46)
AST: 14 U/L (ref 10–35)
Albumin: 4.1 g/dL (ref 3.6–5.1)
Alkaline phosphatase (APISO): 91 U/L (ref 35–144)
BUN: 12 mg/dL (ref 7–25)
CO2: 26 mmol/L (ref 20–32)
Calcium: 9.5 mg/dL (ref 8.6–10.3)
Chloride: 102 mmol/L (ref 98–110)
Creat: 0.91 mg/dL (ref 0.70–1.30)
Globulin: 2.6 g/dL (ref 1.9–3.7)
Glucose, Bld: 123 mg/dL — ABNORMAL HIGH (ref 65–99)
Potassium: 4.4 mmol/L (ref 3.5–5.3)
Sodium: 139 mmol/L (ref 135–146)
Total Bilirubin: 0.4 mg/dL (ref 0.2–1.2)
Total Protein: 6.7 g/dL (ref 6.1–8.1)

## 2023-04-07 LAB — HEMOGLOBIN A1C
Hgb A1c MFr Bld: 8.2 %{Hb} — ABNORMAL HIGH (ref <5.7)
Mean Plasma Glucose: 189 mg/dL
eAG (mmol/L): 10.4 mmol/L

## 2023-04-07 LAB — MICROALBUMIN / CREATININE URINE RATIO
Creatinine, Urine: 146 mg/dL (ref 20–320)
Microalb Creat Ratio: 17 mg/g{creat} (ref <30)
Microalb, Ur: 2.5 mg/dL

## 2023-04-07 LAB — VITAMIN B12: Vitamin B-12: 459 pg/mL (ref 200–1100)

## 2023-04-07 LAB — HEPATITIS C ANTIBODY: Hepatitis C Ab: NONREACTIVE

## 2023-04-07 MED ORDER — EZETIMIBE 10 MG PO TABS: 10.0000 mg | ORAL_TABLET | Freq: Every day | ORAL | 3 refills | Status: DC

## 2023-04-08 ENCOUNTER — Telehealth: Payer: Self-pay | Admitting: *Deleted

## 2023-04-08 NOTE — Progress Notes (Signed)
 Care Guide Pharmacy Note  04/08/2023 Name: Jeremiah Neal MRN: [id number] DOB: 25-Apr-1970  Referred By: Park Meo, FNP Reason for referral: Care Coordination (Initial outreach to schedule referral with PharmD POD 2)   Jeremiah Neal is a 53 y.o. year old male who is a primary care patient of Park Meo, FNP.  Jeremiah Neal was referred to the pharmacist for assistance related to: HTN, HLD, and DMII  Successful contact was made with the patient to discuss pharmacy services.  Patient declines engagement at this time. Contact information was provided to the patient should they wish to reach out for assistance at a later time.  Jeremiah Neal  [facility]  [facility], [facility] Guide  Direct Dial: [phone]  Fax [phone]

## 2023-04-08 NOTE — Progress Notes (Signed)
 Care Guide Pharmacy Note  04/08/2023 Name: Jeremiah Neal MRN: [id number] DOB: September 04, 1970  Referred By: Park Meo, FNP Reason for referral: Care Coordination (Initial outreach to schedule referral with PharmD POD 2)   Jeremiah Neal is a 53 y.o. year old male who is a primary care patient of Park Meo, FNP.  Jeremiah Neal was referred to the pharmacist for assistance related to: HTN, HLD, and DMII  An unsuccessful telephone outreach was attempted today to contact the patient who was referred to the pharmacy team for assistance with medication management. Additional attempts will be made to contact the patient.  Jeremiah Neal  [facility]  [facility], [facility] Guide  Direct Dial: [phone]  Fax [phone]

## 2023-04-15 ENCOUNTER — Other Ambulatory Visit: Payer: Self-pay

## 2023-04-25 DIAGNOSIS — Z419 Encounter for procedure for purposes other than remedying health state, unspecified: Secondary | ICD-10-CM | POA: Diagnosis not present

## 2023-04-27 ENCOUNTER — Other Ambulatory Visit: Payer: Self-pay | Admitting: Family Medicine

## 2023-04-27 ENCOUNTER — Other Ambulatory Visit: Payer: Self-pay

## 2023-04-27 MED ORDER — INSULIN PEN NEEDLE 31G X 8 MM MISC: 0 refills | Status: DC

## 2023-04-27 MED ORDER — GABAPENTIN 100 MG PO CAPS: 100.0000 mg | ORAL_CAPSULE | Freq: Three times a day (TID) | ORAL | 2 refills | Status: DC

## 2023-04-27 MED ORDER — INSULIN PEN NEEDLE 29G X 8MM MISC
1.0000 | Freq: Every day | 1 refills | Status: DC
Start: 2023-04-27 — End: 2023-05-11

## 2023-04-28 DIAGNOSIS — G5603 Carpal tunnel syndrome, bilateral upper limbs: Secondary | ICD-10-CM | POA: Diagnosis not present

## 2023-04-28 DIAGNOSIS — G5623 Lesion of ulnar nerve, bilateral upper limbs: Secondary | ICD-10-CM | POA: Diagnosis not present

## 2023-05-04 ENCOUNTER — Ambulatory Visit: Admitting: Family Medicine

## 2023-05-06 ENCOUNTER — Ambulatory Visit: Admitting: Family Medicine

## 2023-05-07 DIAGNOSIS — G5623 Lesion of ulnar nerve, bilateral upper limbs: Secondary | ICD-10-CM | POA: Diagnosis not present

## 2023-05-07 DIAGNOSIS — G5603 Carpal tunnel syndrome, bilateral upper limbs: Secondary | ICD-10-CM | POA: Diagnosis not present

## 2023-05-11 ENCOUNTER — Other Ambulatory Visit: Payer: Self-pay

## 2023-05-11 ENCOUNTER — Telehealth: Payer: Self-pay

## 2023-05-11 DIAGNOSIS — E1165 Type 2 diabetes mellitus with hyperglycemia: Secondary | ICD-10-CM

## 2023-05-11 MED ORDER — INSULIN PEN NEEDLE 29G X 8MM MISC: 1.0000 | Freq: Every day | 1 refills | Status: DC

## 2023-05-11 NOTE — Telephone Encounter (Signed)
 Copied from CRM [id number]. Topic: Clinical - Prescription Issue >> May 11, 2023  8:19 AM Adaysia C wrote: Reason for CRM: Patients pharmacy continues to say they did not receive the RX:Insulin  Pen Needle 29G X MISC refill request which was sent on 04/27/2023; please follow up with patient pharmacy  [id number] [place], [place] - [address] [facility] [address], [place] [address] Phone: [phone]  Fax: [phone]  Patient has requested a call back with an update #[phone]

## 2023-05-13 ENCOUNTER — Other Ambulatory Visit: Payer: Self-pay

## 2023-05-13 DIAGNOSIS — I700 Atherosclerosis of aorta: Secondary | ICD-10-CM

## 2023-05-13 DIAGNOSIS — E1169 Type 2 diabetes mellitus with other specified complication: Secondary | ICD-10-CM

## 2023-05-13 DIAGNOSIS — E1165 Type 2 diabetes mellitus with hyperglycemia: Secondary | ICD-10-CM

## 2023-05-13 MED ORDER — INSULIN GLARGINE SOLOSTAR 100 UNIT/ML ~~LOC~~ SOPN: 65.0000 [IU] | PEN_INJECTOR | Freq: Every day | SUBCUTANEOUS | 3 refills | Status: DC

## 2023-05-14 DIAGNOSIS — G5621 Lesion of ulnar nerve, right upper limb: Secondary | ICD-10-CM | POA: Diagnosis not present

## 2023-05-14 DIAGNOSIS — G5601 Carpal tunnel syndrome, right upper limb: Secondary | ICD-10-CM | POA: Diagnosis not present

## 2023-05-18 ENCOUNTER — Other Ambulatory Visit: Payer: Self-pay

## 2023-05-18 DIAGNOSIS — E1165 Type 2 diabetes mellitus with hyperglycemia: Secondary | ICD-10-CM

## 2023-05-18 DIAGNOSIS — E1169 Type 2 diabetes mellitus with other specified complication: Secondary | ICD-10-CM

## 2023-05-18 MED ORDER — LEVEMIR FLEXTOUCH 100 UNIT/ML ~~LOC~~ SOPN: 65.0000 [IU] | PEN_INJECTOR | Freq: Every day | SUBCUTANEOUS | 11 refills | Status: DC

## 2023-05-21 ENCOUNTER — Encounter: Payer: Self-pay | Admitting: Family Medicine

## 2023-05-21 ENCOUNTER — Ambulatory Visit (INDEPENDENT_AMBULATORY_CARE_PROVIDER_SITE_OTHER): Admitting: Family Medicine

## 2023-05-21 VITALS — BP 126/76 | HR 117 | Ht 70.0 in | Wt 241.4 lb

## 2023-05-21 DIAGNOSIS — E1169 Type 2 diabetes mellitus with other specified complication: Secondary | ICD-10-CM

## 2023-05-21 DIAGNOSIS — E785 Hyperlipidemia, unspecified: Secondary | ICD-10-CM | POA: Diagnosis not present

## 2023-05-21 DIAGNOSIS — E1165 Type 2 diabetes mellitus with hyperglycemia: Secondary | ICD-10-CM | POA: Diagnosis not present

## 2023-05-21 DIAGNOSIS — Z794 Long term (current) use of insulin: Secondary | ICD-10-CM

## 2023-05-21 DIAGNOSIS — E1159 Type 2 diabetes mellitus with other circulatory complications: Secondary | ICD-10-CM

## 2023-05-21 DIAGNOSIS — I152 Hypertension secondary to endocrine disorders: Secondary | ICD-10-CM | POA: Diagnosis not present

## 2023-05-21 MED ORDER — OZEMPIC (0.25 OR 0.5 MG/DOSE) 2 MG/1.5ML ~~LOC~~ SOPN: PEN_INJECTOR | SUBCUTANEOUS | 0 refills | Status: AC

## 2023-05-21 MED ORDER — EZETIMIBE 10 MG PO TABS: 10.0000 mg | ORAL_TABLET | Freq: Every day | ORAL | 3 refills | Status: DC

## 2023-05-21 NOTE — Assessment & Plan Note (Signed)
 Taking atorvastatin  80mg  daily. Repeat labs today CMP and lipids. Will add zetia  if LDL >70

## 2023-05-21 NOTE — Assessment & Plan Note (Signed)
 A1c 8.2% continue levemir 65 units daily and metformin  1000mg  bid. Start Ozempic 0.25mg  weekly. Has referral to pharm and endo.

## 2023-05-21 NOTE — Progress Notes (Signed)
 Subjective:  HPI: Jeremiah Neal is a 53 y.o. male presenting on 05/21/2023 for No chief complaint on file.   HPI Patient is in today for blood pressure follow up. Has restarted losartan  and continues amlodipine . Needs to start Zetia  and Ozempic. Referral has been placed for pharmacy. Discussed need for endocrinology given his high insulin  needs and uncontrolled diabetes.   HYPERTENSION / HYPERLIPIDEMIA Satisfied with current treatment? yes Duration of hypertension: chronic BP monitoring frequency: not checking BP range:  BP medication side effects: no Past BP meds:  amlodipine , losartan  Duration of hyperlipidemia: chronic Cholesterol medication side effects: no Cholesterol supplements: none Past cholesterol medications: atorvastatin  Medication compliance: good compliance Aspirin: no Recent stressors: no Recurrent headaches: no Visual changes: no Palpitations: no Dyspnea: no Chest pain: no Lower extremity edema: no Dizzy/lightheaded: no   Review of Systems  All other systems reviewed and are negative.   Relevant past medical history reviewed and updated as indicated.   Past Medical History:  Diagnosis Date   Diabetes mellitus without complication (HCC)    type 2   Diverticulitis    Hyperlipidemia    Hypertension      History reviewed. No pertinent surgical history.  Allergies and medications reviewed and updated.   Current Outpatient Medications:    acetaminophen  (TYLENOL ) 500 MG tablet, Take 500 mg by mouth every 6 (six) hours as needed for mild pain., Disp: , Rfl:    amLODipine  (NORVASC ) 10 MG tablet, Take 1 tablet (10 mg total) by mouth daily., Disp: 90 tablet, Rfl: 1   aspirin EC 81 MG tablet, Take 81 mg by mouth daily as needed for moderate pain. Swallow whole., Disp: , Rfl:    atorvastatin  (LIPITOR) 80 MG tablet, Take 1 tablet (80 mg total) by mouth daily., Disp: 90 tablet, Rfl: 1   ezetimibe  (ZETIA ) 10 MG tablet, Take 1 tablet (10 mg total) by mouth  daily., Disp: 90 tablet, Rfl: 3   gabapentin  (NEURONTIN ) 100 MG capsule, Take 1-2 capsules (100-200 mg total) by mouth 3 (three) times daily., Disp: 180 capsule, Rfl: 2   insulin  detemir (LEVEMIR FLEXTOUCH) 100 UNIT/ML FlexPen, Inject 65 Units into the skin daily., Disp: 15 mL, Rfl: 11   Insulin  Pen Needle 29G X MISC, 1 Needle by Does not apply route daily., Disp: 100 each, Rfl: 1   losartan  (COZAAR ) 100 MG tablet, Take 1 tablet (100 mg total) by mouth daily., Disp: 90 tablet, Rfl: 1   metFORMIN  (GLUCOPHAGE ) 1000 MG tablet, Take 1 tablet (1,000 mg total) by mouth 2 (two) times daily with a meal., Disp: 180 tablet, Rfl: 1   Semaglutide,0.25 or 0.5MG /DOS, (OZEMPIC, 0.25 OR 0.5 MG/DOSE,) 2 MG/1.5ML SOPN, Inject 0.25 mg into the skin once a week for 28 days, THEN 0.5 mg once a week for 28 days., Disp: 1.5 mL, Rfl: 0  Allergies  Allergen Reactions   Penicillins Nausea Only    Has patient had a PCN reaction causing immediate rash, facial/tongue/throat swelling, SOB or lightheadedness with hypotension: No Has patient had a PCN reaction causing severe rash involving mucus membranes or skin necrosis: No Has patient had a PCN reaction that required hospitalization: No Has patient had a PCN reaction occurring within the last 10 years: Yes If all of the above answers are "NO", then may proceed with Cephalosporin use.     Objective:   BP 126/76   Pulse (!) 117   Ht 5\' 10"  (1.778 m)   Wt 241 lb 6 oz (109.5 kg)  SpO2 93%   BMI 34.63 kg/m      05/21/2023    3:04 PM 04/06/2023   11:44 AM 04/06/2023   10:57 AM  Vitals with BMI  Height 5\' 10"   5\' 10"   Weight 241 lbs 6 oz  245 lbs  BMI 34.63  35.15  Systolic 126 140 409  Diastolic 76 88 100  Pulse 117  93     Physical Exam Vitals and nursing note reviewed.  Constitutional:      Appearance: Normal appearance. He is normal weight.  HENT:     Head: Normocephalic and atraumatic.  Cardiovascular:     Rate and Rhythm: Normal rate and regular  rhythm.     Pulses: Normal pulses.     Heart sounds: Normal heart sounds.  Pulmonary:     Effort: Pulmonary effort is normal.     Breath sounds: Normal breath sounds.  Skin:    General: Skin is warm and dry.     Capillary Refill: Capillary refill takes less than 2 seconds.  Neurological:     General: No focal deficit present.     Mental Status: He is alert and oriented to person, place, and time. Mental status is at baseline.  Psychiatric:        Mood and Affect: Mood normal.        Behavior: Behavior normal.        Thought Content: Thought content normal.        Judgment: Judgment normal.     Assessment & Plan:  Hyperlipidemia associated with type 2 diabetes mellitus (HCC) Assessment & Plan: Taking atorvastatin  80mg  daily. Repeat labs today CMP and lipids. Will add zetia  if LDL >70  Orders: -     Comprehensive metabolic panel with GFR -     Lipid panel  Hypertension associated with diabetes (HCC) Assessment & Plan: Well controlled on Amlodipine  10mg  daily and Losartan  100mg  daily. Recommend heart healthy diet such as Mediterranean diet with whole grains, fruits, vegetable, fish, lean meats, nuts, and olive oil. Limit salt. Encouraged moderate walking, 3-5 times/week for 30-50 minutes each session. Aim for at least 150 minutes.week. Goal should be pace of 3 miles/hours, or walking 1.5 miles in 30 minutes. Avoid tobacco products. Avoid excess alcohol. Take medications as prescribed and bring medications and blood pressure log with cuff to each office visit. Seek medical care for chest pain, palpitations, shortness of breath with exertion, dizziness/lightheadedness, vision changes, recurrent headaches, or swelling of extremities. Follow up in 1 month   Controlled type 2 diabetes mellitus with hyperglycemia, with long-term current use of insulin  (HCC) Assessment & Plan: A1c 8.2% continue levemir 65 units daily and metformin  1000mg  bid. Start Ozempic 0.25mg  weekly. Has referral to  pharm and endo.  Orders: -     Ambulatory referral to Endocrinology  Other orders -     Ozempic (0.25 or 0.5 MG/DOSE); Inject 0.25 mg into the skin once a week for 28 days, THEN 0.5 mg once a week for 28 days.  Dispense: 1.5 mL; Refill: 0 -     Ezetimibe ; Take 1 tablet (10 mg total) by mouth daily.  Dispense: 90 tablet; Refill: 3     Follow up plan: Return in about 4 weeks (around 06/18/2023) for follow-up.  Jenelle Mis, FNP

## 2023-05-21 NOTE — Assessment & Plan Note (Signed)
 Well controlled on Amlodipine  10mg  daily and Losartan  100mg  daily. Recommend heart healthy diet such as Mediterranean diet with whole grains, fruits, vegetable, fish, lean meats, nuts, and olive oil. Limit salt. Encouraged moderate walking, 3-5 times/week for 30-50 minutes each session. Aim for at least 150 minutes.week. Goal should be pace of 3 miles/hours, or walking 1.5 miles in 30 minutes. Avoid tobacco products. Avoid excess alcohol. Take medications as prescribed and bring medications and blood pressure log with cuff to each office visit. Seek medical care for chest pain, palpitations, shortness of breath with exertion, dizziness/lightheadedness, vision changes, recurrent headaches, or swelling of extremities. Follow up in 1 month

## 2023-05-22 ENCOUNTER — Other Ambulatory Visit: Payer: Self-pay

## 2023-05-22 DIAGNOSIS — E1169 Type 2 diabetes mellitus with other specified complication: Secondary | ICD-10-CM

## 2023-05-22 DIAGNOSIS — E785 Hyperlipidemia, unspecified: Secondary | ICD-10-CM

## 2023-05-22 DIAGNOSIS — E1165 Type 2 diabetes mellitus with hyperglycemia: Secondary | ICD-10-CM

## 2023-05-22 LAB — COMPREHENSIVE METABOLIC PANEL WITH GFR
AG Ratio: 1.8 (calc) (ref 1.0–2.5)
ALT: 22 U/L (ref 9–46)
AST: 13 U/L (ref 10–35)
Albumin: 4.2 g/dL (ref 3.6–5.1)
Alkaline phosphatase (APISO): 94 U/L (ref 35–144)
BUN: 22 mg/dL (ref 7–25)
CO2: 30 mmol/L (ref 20–32)
Calcium: 9.9 mg/dL (ref 8.6–10.3)
Chloride: 100 mmol/L (ref 98–110)
Creat: 1.09 mg/dL (ref 0.70–1.30)
Globulin: 2.3 g/dL (ref 1.9–3.7)
Glucose, Bld: 126 mg/dL — ABNORMAL HIGH (ref 65–99)
Potassium: 4.2 mmol/L (ref 3.5–5.3)
Sodium: 139 mmol/L (ref 135–146)
Total Bilirubin: 0.4 mg/dL (ref 0.2–1.2)
Total Protein: 6.5 g/dL (ref 6.1–8.1)
eGFR: 81 mL/min/{1.73_m2} (ref >=60)

## 2023-05-22 LAB — LIPID PANEL
Cholesterol: 134 mg/dL (ref <200)
HDL: 35 mg/dL — ABNORMAL LOW (ref >=40)
LDL Cholesterol (Calc): 77 mg/dL
Non-HDL Cholesterol (Calc): 99 mg/dL (ref <130)
Total CHOL/HDL Ratio: 3.8 (calc) (ref <5.0)
Triglycerides: 139 mg/dL (ref <150)

## 2023-05-22 MED ORDER — LEVEMIR FLEXTOUCH 100 UNIT/ML ~~LOC~~ SOPN: 65.0000 [IU] | PEN_INJECTOR | Freq: Every day | SUBCUTANEOUS | 11 refills | Status: DC

## 2023-05-25 DIAGNOSIS — Z419 Encounter for procedure for purposes other than remedying health state, unspecified: Secondary | ICD-10-CM | POA: Diagnosis not present

## 2023-05-26 ENCOUNTER — Ambulatory Visit: Payer: Self-pay | Admitting: Family Medicine

## 2023-06-11 ENCOUNTER — Ambulatory Visit: Admitting: Family Medicine

## 2023-06-25 DIAGNOSIS — Z419 Encounter for procedure for purposes other than remedying health state, unspecified: Secondary | ICD-10-CM | POA: Diagnosis not present

## 2023-07-13 ENCOUNTER — Ambulatory Visit: Admitting: Family Medicine

## 2023-07-13 ENCOUNTER — Encounter: Payer: Self-pay | Admitting: Family Medicine

## 2023-07-13 ENCOUNTER — Telehealth: Payer: Self-pay

## 2023-07-13 VITALS — BP 122/83 | HR 96 | Temp 98.5°F | Ht 70.0 in | Wt 251.8 lb

## 2023-07-13 DIAGNOSIS — E1169 Type 2 diabetes mellitus with other specified complication: Secondary | ICD-10-CM

## 2023-07-13 DIAGNOSIS — E1165 Type 2 diabetes mellitus with hyperglycemia: Secondary | ICD-10-CM

## 2023-07-13 DIAGNOSIS — E785 Hyperlipidemia, unspecified: Secondary | ICD-10-CM

## 2023-07-13 DIAGNOSIS — F32A Depression, unspecified: Secondary | ICD-10-CM | POA: Diagnosis not present

## 2023-07-13 DIAGNOSIS — Z8589 Personal history of malignant neoplasm of other organs and systems: Secondary | ICD-10-CM

## 2023-07-13 DIAGNOSIS — Z7984 Long term (current) use of oral hypoglycemic drugs: Secondary | ICD-10-CM

## 2023-07-13 DIAGNOSIS — I152 Hypertension secondary to endocrine disorders: Secondary | ICD-10-CM | POA: Diagnosis not present

## 2023-07-13 DIAGNOSIS — E1159 Type 2 diabetes mellitus with other circulatory complications: Secondary | ICD-10-CM

## 2023-07-13 DIAGNOSIS — F419 Anxiety disorder, unspecified: Secondary | ICD-10-CM | POA: Diagnosis not present

## 2023-07-13 DIAGNOSIS — Z794 Long term (current) use of insulin: Secondary | ICD-10-CM

## 2023-07-13 NOTE — Progress Notes (Signed)
 Subjective:  HPI: Jeremiah Neal is a 53 y.o. male presenting on 07/13/2023 for Medical Management of Chronic Issues (7 wk f/u /Order referral for eye exam has not been in several years )   HPI Patient is in today for follow up for DM, HLD, HTN. Has not started Ozempic  or Zetia .  HYPERTENSION / HYPERLIPIDEMIA Satisfied with current treatment? yes Duration of hypertension: chronic BP monitoring frequency: not checking BP range:  BP medication side effects: no Past BP meds:  amlodipine , losartan  Duration of hyperlipidemia: chronic Cholesterol medication side effects: no Cholesterol supplements: none Past cholesterol medications:  zetia , atorvastatin  Medication compliance: excellent compliance Aspirin: yes Recent stressors: no Recurrent headaches: no Visual changes: no Palpitations: no Dyspnea: no Chest pain: no Lower extremity edema: no Dizzy/lightheaded: no  DIABETES Hypoglycemic episodes:no Polydipsia/polyuria: no Visual disturbance: no Chest pain: no Paresthesias: no Glucose Monitoring: no  Accucheck frequency: Not Checking  Fasting glucose:  Post prandial:  Evening:  Before meals: Taking Insulin ?: yes  Long acting insulin : 65 units daily  Short acting insulin : Blood Pressure Monitoring: not checking Retinal Examination: Not up to Date Foot Exam: Not up to Date Diabetic Education: Completed Pneumovax: Not up to Date Influenza: Up to Date Aspirin: yes   Review of Systems  All other systems reviewed and are negative.   Relevant past medical history reviewed and updated as indicated.   Past Medical History:  Diagnosis Date   Diabetes mellitus without complication (HCC)    type 2   Diverticulitis    Hyperlipidemia    Hypertension      History reviewed. No pertinent surgical history.  Allergies and medications reviewed and updated.   Current Outpatient Medications:    acetaminophen  (TYLENOL ) 500 MG tablet, Take 500 mg by mouth every 6 (six)  hours as needed for mild pain., Disp: , Rfl:    amLODipine  (NORVASC ) 10 MG tablet, Take 1 tablet (10 mg total) by mouth daily., Disp: 90 tablet, Rfl: 1   aspirin EC 81 MG tablet, Take 81 mg by mouth daily as needed for moderate pain. Swallow whole., Disp: , Rfl:    atorvastatin  (LIPITOR) 80 MG tablet, Take 1 tablet (80 mg total) by mouth daily., Disp: 90 tablet, Rfl: 1   gabapentin  (NEURONTIN ) 100 MG capsule, Take 1-2 capsules (100-200 mg total) by mouth 3 (three) times daily., Disp: 180 capsule, Rfl: 2   insulin  detemir (LEVEMIR  FLEXTOUCH) 100 UNIT/ML FlexPen, Inject 65 Units into the skin daily., Disp: 15 mL, Rfl: 11   Insulin  Pen Needle 29G X MISC, 1 Needle by Does not apply route daily., Disp: 100 each, Rfl: 1   losartan  (COZAAR ) 100 MG tablet, Take 1 tablet (100 mg total) by mouth daily., Disp: 90 tablet, Rfl: 1   metFORMIN  (GLUCOPHAGE ) 1000 MG tablet, Take 1 tablet (1,000 mg total) by mouth 2 (two) times daily with a meal., Disp: 180 tablet, Rfl: 1   ezetimibe  (ZETIA ) 10 MG tablet, Take 1 tablet (10 mg total) by mouth daily. (Patient not taking: Reported on 07/13/2023), Disp: 90 tablet, Rfl: 3   Semaglutide ,0.25 or 0.5MG /DOS, (OZEMPIC , 0.25 OR 0.5 MG/DOSE,) 2 MG/1.5ML SOPN, Inject 0.25 mg into the skin once a week for 28 days, THEN 0.5 mg once a week for 28 days. (Patient not taking: No sig reported), Disp: 1.5 mL, Rfl: 0  Allergies  Allergen Reactions   Penicillins Nausea Only    Has patient had a PCN reaction causing immediate rash, facial/tongue/throat swelling, SOB or lightheadedness with hypotension: No  Has patient had a PCN reaction causing severe rash involving mucus membranes or skin necrosis: No Has patient had a PCN reaction that required hospitalization: No Has patient had a PCN reaction occurring within the last 10 years: Yes If all of the above answers are NO, then may proceed with Cephalosporin use.     Objective:   BP 122/83   Pulse 96   Temp 98.5 F (36.9 C)    Ht 5' 10 (1.778 m)   Wt 251 lb 12.8 oz (114.2 kg)   SpO2 96%   BMI 36.13 kg/m      07/13/2023    2:51 PM 05/21/2023    3:04 PM 04/06/2023   11:44 AM  Vitals with BMI  Height 5' 10 5' 10   Weight 251 lbs 13 oz 241 lbs 6 oz   BMI 36.13 34.63   Systolic 122 126 859  Diastolic 83 76 88  Pulse 96 117      Physical Exam Vitals and nursing note reviewed.  Constitutional:      Appearance: Normal appearance. He is obese.  HENT:     Head: Normocephalic and atraumatic.   Cardiovascular:     Rate and Rhythm: Normal rate and regular rhythm.     Pulses: Normal pulses.     Heart sounds: Normal heart sounds.  Pulmonary:     Effort: Pulmonary effort is normal.     Breath sounds: Normal breath sounds.   Skin:    General: Skin is warm and dry.     Capillary Refill: Capillary refill takes less than 2 seconds.   Neurological:     General: No focal deficit present.     Mental Status: He is alert and oriented to person, place, and time. Mental status is at baseline.   Psychiatric:        Mood and Affect: Mood is anxious.        Behavior: Behavior normal.        Thought Content: Thought content normal.        Judgment: Judgment normal.     Assessment & Plan:  Uncontrolled diabetes mellitus with hyperglycemia, with long-term current use of insulin  (HCC) Assessment & Plan: A1c 8.2% continue levemir  65 units daily and metformin  1000mg  bid. No approval yet for Ozempic  0.25mg  weekly, will check on PA. Has referral to endo working on scheduling.  A1c and uACR today. Foot exam declined. Vaccines declined. Retinal eye exam declined. Recommend heart healthy diet such as Mediterranean diet with whole grains, fruits, vegetable, fish, lean meats, nuts, and olive oil. Limit salt. Encouraged moderate walking, 3-5 times/week for 30-50 minutes each session. Aim for at least 150 minutes.week. Goal should be pace of 3 miles/hours, or walking 1.5 miles in 30 minutes. Seek medical care for urinary  frequency, extreme thirst, vision changes, lightheadedness, dizziness.  Follow up in 3 months    History of squamous cell carcinoma Assessment & Plan: Referral to dermatology as requested.   Orders: -     Ambulatory referral to Dermatology  Controlled type 2 diabetes mellitus with hyperglycemia, with long-term current use of insulin  (HCC) -     Hemoglobin A1c  Hyperlipidemia associated with type 2 diabetes mellitus (HCC) Assessment & Plan: Taking atorvastatin  80mg  daily. Encouraged to start Zetia  as LDL is >70. Your labs showed elevated cholesterol. I recommend consuming a heart healthy diet such as Mediterranean diet or DASH diet with whole grains, fruits, vegetable, fish, lean meats, nuts, and olive oil. Limit sweets and processed  foods. I also encourage moderate intensity exercise 150 minutes weekly. This is 3-5 times weekly for 30-50 minutes each session. Goal should be pace of 3 miles/hours, or walking 1.5 miles in 30 minutes. The 10-year ASCVD risk score (Arnett DK, et al., 2019) is: 16.7%    Hypertension associated with diabetes [facility]) Assessment & Plan: Well controlled on Amlodipine  10mg  daily and Losartan  100mg  daily. Recommend heart healthy diet such as Mediterranean diet with whole grains, fruits, vegetable, fish, lean meats, nuts, and olive oil. Limit salt. Encouraged moderate walking, 3-5 times/week for 30-50 minutes each session. Aim for at least 150 minutes.week. Goal should be pace of 3 miles/hours, or walking 1.5 miles in 30 minutes. Avoid tobacco products. Avoid excess alcohol. Take medications as prescribed and bring medications and blood pressure log with cuff to each office visit. Seek medical care for chest pain, palpitations, shortness of breath with exertion, dizziness/lightheadedness, vision changes, recurrent headaches, or swelling of extremities. 3 month follow up   Anxiety and depression Assessment & Plan: Did not complete GAD or PHQ. Appears very anxious  today in office, does have a lot going on at home including lack of a job and health concerns. Endorses anhedonia, lack of motivation, depression, irritability. Denies SI/HI. Discussed treatment options including therapy and a multimodal therapy such as Cymbalta in the setting of his chronic pain. He will consider this and let me know if he would like to start.       Follow up plan: Return in about 3 months (around 10/13/2023) for chronic condition management and retinal exam with labs week prior.  [name], FNP

## 2023-07-13 NOTE — Assessment & Plan Note (Signed)
 Did not complete GAD or PHQ. Appears very anxious today in office, does have a lot going on at home including lack of a job and health concerns. Endorses anhedonia, lack of motivation, depression, irritability. Denies SI/HI. Discussed treatment options including therapy and a multimodal therapy such as Cymbalta in the setting of his chronic pain. He will consider this and let me know if he would like to start.

## 2023-07-13 NOTE — Assessment & Plan Note (Signed)
Referral to dermatology as requested.

## 2023-07-13 NOTE — Assessment & Plan Note (Addendum)
 A1c 8.2% continue levemir  65 units daily and metformin  1000mg  bid. No approval yet for Ozempic  0.25mg  weekly, will check on PA. Has referral to endo working on scheduling.  A1c and uACR today. Foot exam declined. Vaccines declined. Retinal eye exam declined. Recommend heart healthy diet such as Mediterranean diet with whole grains, fruits, vegetable, fish, lean meats, nuts, and olive oil. Limit salt. Encouraged moderate walking, 3-5 times/week for 30-50 minutes each session. Aim for at least 150 minutes.week. Goal should be pace of 3 miles/hours, or walking 1.5 miles in 30 minutes. Seek medical care for urinary frequency, extreme thirst, vision changes, lightheadedness, dizziness.  Follow up in 3 months

## 2023-07-13 NOTE — Assessment & Plan Note (Signed)
 Taking atorvastatin  80mg  daily. Encouraged to start Zetia  as LDL is >70. Your labs showed elevated cholesterol. I recommend consuming a heart healthy diet such as Mediterranean diet or DASH diet with whole grains, fruits, vegetable, fish, lean meats, nuts, and olive oil. Limit sweets and processed foods. I also encourage moderate intensity exercise 150 minutes weekly. This is 3-5 times weekly for 30-50 minutes each session. Goal should be pace of 3 miles/hours, or walking 1.5 miles in 30 minutes. The 10-year ASCVD risk score (Arnett DK, et al., 2019) is: 16.7%

## 2023-07-13 NOTE — Assessment & Plan Note (Signed)
 Well controlled on Amlodipine  10mg  daily and Losartan  100mg  daily. Recommend heart healthy diet such as Mediterranean diet with whole grains, fruits, vegetable, fish, lean meats, nuts, and olive oil. Limit salt. Encouraged moderate walking, 3-5 times/week for 30-50 minutes each session. Aim for at least 150 minutes.week. Goal should be pace of 3 miles/hours, or walking 1.5 miles in 30 minutes. Avoid tobacco products. Avoid excess alcohol. Take medications as prescribed and bring medications and blood pressure log with cuff to each office visit. Seek medical care for chest pain, palpitations, shortness of breath with exertion, dizziness/lightheadedness, vision changes, recurrent headaches, or swelling of extremities. 3 month follow up

## 2023-07-13 NOTE — Telephone Encounter (Signed)
 Previous encounter has request for PA

## 2023-07-14 ENCOUNTER — Other Ambulatory Visit (HOSPITAL_COMMUNITY): Payer: Self-pay

## 2023-07-14 ENCOUNTER — Ambulatory Visit: Payer: Self-pay | Admitting: Family Medicine

## 2023-07-14 ENCOUNTER — Ambulatory Visit

## 2023-07-14 LAB — HEMOGLOBIN A1C
Hgb A1c MFr Bld: 9.9 % — ABNORMAL HIGH (ref <5.7)
Mean Plasma Glucose: 237 mg/dL
eAG (mmol/L): 13.2 mmol/L

## 2023-07-14 NOTE — Telephone Encounter (Signed)
 Good morning, Jeremiah Neal's medicaid plan is stating that he has Primary coverage that we need to submit his prescription to so I contacted his medicaid plan and they are showing he has [employer] as Primary coverage and that it's showing active since 10/2022. I contacted Jeremiah Neal and left a message on his voicemail letting him know that he need to call [phone] and let them know that he only has medicaid if that's the case so that I can proceed with the PA for Ozempic 

## 2023-07-15 NOTE — Telephone Encounter (Signed)
 Attempt to contact by phone. Lvm for callback to give update and request assistance w/ insurance.

## 2023-07-16 ENCOUNTER — Telehealth: Payer: Self-pay

## 2023-07-16 ENCOUNTER — Telehealth: Payer: Self-pay | Admitting: Family Medicine

## 2023-07-16 NOTE — Telephone Encounter (Signed)
 Reached out to PA team to confirm that pt. Only has medicaid and would like to move forward w/ medication authorization.

## 2023-07-16 NOTE — Telephone Encounter (Signed)
 Copied from CRM [id number]. Topic: General - Other >> Jul 16, 2023  1:20 PM Fonda T wrote: Reason for CRM: Patient states received call from office, is returning call.  Per chart review, no recent messages seen from office yesterday or today.  Please respond back via mychart regarding missed call from yesterday, 07/15/23.

## 2023-07-20 ENCOUNTER — Telehealth: Payer: Self-pay | Admitting: Pharmacy Technician

## 2023-07-20 ENCOUNTER — Other Ambulatory Visit (HOSPITAL_COMMUNITY): Payer: Self-pay

## 2023-07-20 NOTE — Telephone Encounter (Signed)
 Pharmacy Patient Advocate Encounter  Received notification from [facility] Medicaid that Prior Authorization for Ozempic  (0.25 or 0.5 MG/DOSE) 2MG /3ML pen-injectors has been APPROVED from 07/06/23 to 07/19/24. Ran test claim, Copay is $4.00. This test claim was processed through [facility]- copay amounts may vary at other pharmacies due to pharmacy/plan contracts, or as the patient moves through the different stages of their insurance plan.   PA #/Case ID/Reference #: [id number]

## 2023-07-20 NOTE — Telephone Encounter (Signed)
 PA request has been Submitted. New Encounter has been or will be created for follow up. For additional info see Pharmacy Prior Auth telephone encounter from 07/20/23.

## 2023-07-20 NOTE — Telephone Encounter (Signed)
 Pharmacy Patient Advocate Encounter   Received notification from Pt Calls Messages that prior authorization for Ozempic  (0.25 or 0.5 MG/DOSE) 2MG /3ML pen-injectors is required/requested.   Insurance verification completed.   The patient is insured through [facility] [place] .   Per test claim: PA required; PA submitted to above mentioned insurance via CoverMyMeds Key/confirmation #/EOC [id number] Status is pending

## 2023-07-21 ENCOUNTER — Ambulatory Visit: Admitting: Family Medicine

## 2023-07-22 ENCOUNTER — Other Ambulatory Visit: Payer: Self-pay | Admitting: Family Medicine

## 2023-07-22 MED ORDER — OZEMPIC (0.25 OR 0.5 MG/DOSE) 2 MG/1.5ML ~~LOC~~ SOPN: 0.2500 mg | PEN_INJECTOR | SUBCUTANEOUS | 0 refills | Status: DC

## 2023-07-25 DIAGNOSIS — Z419 Encounter for procedure for purposes other than remedying health state, unspecified: Secondary | ICD-10-CM | POA: Diagnosis not present

## 2023-07-26 ENCOUNTER — Other Ambulatory Visit: Payer: Self-pay | Admitting: Family Medicine

## 2023-07-26 DIAGNOSIS — E1165 Type 2 diabetes mellitus with hyperglycemia: Secondary | ICD-10-CM

## 2023-08-03 ENCOUNTER — Emergency Department

## 2023-08-03 ENCOUNTER — Inpatient Hospital Stay
Admission: EM | Admit: 2023-08-03 | Discharge: 2023-08-04 | DRG: 321 | Disposition: A | Discharge status: Home / Self Care | Attending: Internal Medicine | Admitting: Internal Medicine

## 2023-08-03 ENCOUNTER — Other Ambulatory Visit: Payer: Self-pay

## 2023-08-03 ENCOUNTER — Inpatient Hospital Stay (HOSPITAL_COMMUNITY)
Admit: 2023-08-03 | Discharge: 2023-08-03 | Disposition: A | Discharge status: Home / Self Care | Attending: Internal Medicine | Admitting: Internal Medicine

## 2023-08-03 ENCOUNTER — Encounter
Admission: EM | Disposition: A | Discharge status: Home / Self Care | Payer: Self-pay | Source: Home / Self Care | Attending: Internal Medicine

## 2023-08-03 DIAGNOSIS — F17210 Nicotine dependence, cigarettes, uncomplicated: Secondary | ICD-10-CM | POA: Diagnosis present

## 2023-08-03 DIAGNOSIS — I10 Essential (primary) hypertension: Secondary | ICD-10-CM | POA: Diagnosis present

## 2023-08-03 DIAGNOSIS — I214 Non-ST elevation (NSTEMI) myocardial infarction: Secondary | ICD-10-CM | POA: Diagnosis not present

## 2023-08-03 DIAGNOSIS — E1165 Type 2 diabetes mellitus with hyperglycemia: Secondary | ICD-10-CM | POA: Diagnosis not present

## 2023-08-03 DIAGNOSIS — K5730 Diverticulosis of large intestine without perforation or abscess without bleeding: Secondary | ICD-10-CM | POA: Diagnosis not present

## 2023-08-03 DIAGNOSIS — Z794 Long term (current) use of insulin: Secondary | ICD-10-CM | POA: Diagnosis not present

## 2023-08-03 DIAGNOSIS — D72829 Elevated white blood cell count, unspecified: Secondary | ICD-10-CM | POA: Diagnosis present

## 2023-08-03 DIAGNOSIS — F1290 Cannabis use, unspecified, uncomplicated: Secondary | ICD-10-CM | POA: Diagnosis present

## 2023-08-03 DIAGNOSIS — I255 Ischemic cardiomyopathy: Secondary | ICD-10-CM | POA: Diagnosis present

## 2023-08-03 DIAGNOSIS — Z743 Need for continuous supervision: Secondary | ICD-10-CM | POA: Diagnosis not present

## 2023-08-03 DIAGNOSIS — I2510 Atherosclerotic heart disease of native coronary artery without angina pectoris: Secondary | ICD-10-CM | POA: Diagnosis present

## 2023-08-03 DIAGNOSIS — R079 Chest pain, unspecified: Secondary | ICD-10-CM | POA: Diagnosis not present

## 2023-08-03 DIAGNOSIS — Z7982 Long term (current) use of aspirin: Secondary | ICD-10-CM | POA: Diagnosis not present

## 2023-08-03 DIAGNOSIS — F1910 Other psychoactive substance abuse, uncomplicated: Secondary | ICD-10-CM | POA: Diagnosis not present

## 2023-08-03 DIAGNOSIS — Z7985 Long-term (current) use of injectable non-insulin antidiabetic drugs: Secondary | ICD-10-CM

## 2023-08-03 DIAGNOSIS — F1490 Cocaine use, unspecified, uncomplicated: Secondary | ICD-10-CM | POA: Diagnosis present

## 2023-08-03 DIAGNOSIS — E669 Obesity, unspecified: Secondary | ICD-10-CM | POA: Diagnosis present

## 2023-08-03 DIAGNOSIS — Z7984 Long term (current) use of oral hypoglycemic drugs: Secondary | ICD-10-CM

## 2023-08-03 DIAGNOSIS — Z880 Allergy status to penicillin: Secondary | ICD-10-CM | POA: Diagnosis not present

## 2023-08-03 DIAGNOSIS — I700 Atherosclerosis of aorta: Secondary | ICD-10-CM | POA: Diagnosis not present

## 2023-08-03 DIAGNOSIS — Z515 Encounter for palliative care: Secondary | ICD-10-CM | POA: Diagnosis not present

## 2023-08-03 DIAGNOSIS — I5021 Acute systolic (congestive) heart failure: Secondary | ICD-10-CM | POA: Diagnosis not present

## 2023-08-03 DIAGNOSIS — Z79899 Other long term (current) drug therapy: Secondary | ICD-10-CM | POA: Diagnosis not present

## 2023-08-03 DIAGNOSIS — Z6836 Body mass index (BMI) 36.0-36.9, adult: Secondary | ICD-10-CM

## 2023-08-03 DIAGNOSIS — E1169 Type 2 diabetes mellitus with other specified complication: Secondary | ICD-10-CM | POA: Diagnosis present

## 2023-08-03 DIAGNOSIS — I110 Hypertensive heart disease with heart failure: Secondary | ICD-10-CM | POA: Diagnosis present

## 2023-08-03 DIAGNOSIS — Z955 Presence of coronary angioplasty implant and graft: Secondary | ICD-10-CM | POA: Diagnosis not present

## 2023-08-03 DIAGNOSIS — E118 Type 2 diabetes mellitus with unspecified complications: Secondary | ICD-10-CM | POA: Diagnosis present

## 2023-08-03 DIAGNOSIS — Z8249 Family history of ischemic heart disease and other diseases of the circulatory system: Secondary | ICD-10-CM | POA: Diagnosis not present

## 2023-08-03 DIAGNOSIS — Z91148 Patient's other noncompliance with medication regimen for other reason: Secondary | ICD-10-CM

## 2023-08-03 DIAGNOSIS — E785 Hyperlipidemia, unspecified: Secondary | ICD-10-CM | POA: Diagnosis present

## 2023-08-03 DIAGNOSIS — R031 Nonspecific low blood-pressure reading: Secondary | ICD-10-CM | POA: Diagnosis not present

## 2023-08-03 HISTORY — PX: LEFT HEART CATH AND CORONARY ANGIOGRAPHY: CATH118249

## 2023-08-03 HISTORY — PX: CORONARY STENT INTERVENTION: CATH118234

## 2023-08-03 LAB — CBC WITH DIFFERENTIAL/PLATELET
Abs Immature Granulocytes: 0.07 K/uL (ref 0.00–0.07)
Basophils Absolute: 0 K/uL (ref 0.0–0.1)
Basophils Relative: 0 %
Eosinophils Absolute: 0.2 K/uL (ref 0.0–0.5)
Eosinophils Relative: 2 %
HCT: 45.4 % (ref 39.0–52.0)
Hemoglobin: 14.5 g/dL (ref 13.0–17.0)
Immature Granulocytes: 1 %
Lymphocytes Relative: 10 %
Lymphs Abs: 1.4 K/uL (ref 0.7–4.0)
MCH: 26.9 pg (ref 26.0–34.0)
MCHC: 31.9 g/dL (ref 30.0–36.0)
MCV: 84.2 fL (ref 80.0–100.0)
Monocytes Absolute: 0.9 K/uL (ref 0.1–1.0)
Monocytes Relative: 6 %
Neutro Abs: 11.8 K/uL — ABNORMAL HIGH (ref 1.7–7.7)
Neutrophils Relative %: 81 %
Platelets: 253 K/uL (ref 150–400)
RBC: 5.39 MIL/uL (ref 4.22–5.81)
RDW: 14.7 % (ref 11.5–15.5)
WBC: 14.4 K/uL — ABNORMAL HIGH (ref 4.0–10.5)
nRBC: 0 % (ref 0.0–0.2)

## 2023-08-03 LAB — GLUCOSE, CAPILLARY
Glucose-Capillary: 201 mg/dL — ABNORMAL HIGH (ref 70–99)
Glucose-Capillary: 236 mg/dL — ABNORMAL HIGH (ref 70–99)
Glucose-Capillary: 264 mg/dL — ABNORMAL HIGH (ref 70–99)

## 2023-08-03 LAB — HIV ANTIBODY (ROUTINE TESTING W REFLEX): HIV Screen 4th Generation wRfx: NONREACTIVE

## 2023-08-03 LAB — COMPREHENSIVE METABOLIC PANEL WITH GFR
ALT: 27 U/L (ref 0–44)
AST: 21 U/L (ref 15–41)
Albumin: 3.7 g/dL (ref 3.5–5.0)
Alkaline Phosphatase: 81 U/L (ref 38–126)
Anion gap: 10 (ref 5–15)
BUN: 20 mg/dL (ref 6–20)
CO2: 25 mmol/L (ref 22–32)
Calcium: 8.9 mg/dL (ref 8.9–10.3)
Chloride: 100 mmol/L (ref 98–111)
Creatinine, Ser: 1.03 mg/dL (ref 0.61–1.24)
GFR, Estimated: >60 mL/min (ref >60)
Glucose, Bld: 261 mg/dL — ABNORMAL HIGH (ref 70–99)
Potassium: 4.2 mmol/L (ref 3.5–5.1)
Sodium: 135 mmol/L (ref 135–145)
Total Bilirubin: 0.5 mg/dL (ref 0.0–1.2)
Total Protein: 6.5 g/dL (ref 6.5–8.1)

## 2023-08-03 LAB — POCT ACTIVATED CLOTTING TIME
Activated Clotting Time: 245 s
Activated Clotting Time: 291 s
Activated Clotting Time: 291 s

## 2023-08-03 LAB — ECHOCARDIOGRAM COMPLETE
AR max vel: 3.72 cm2
AV Area VTI: 3.81 cm2
AV Area mean vel: 3.62 cm2
AV Mean grad: 2 mmHg
AV Peak grad: 2.9 mmHg
Ao pk vel: 0.86 m/s
Area-P 1/2: 4.57 cm2
Height: 70 in
MV VTI: 1.87 cm2
S' Lateral: 2.8 cm
Weight: 4091.74 [oz_av]

## 2023-08-03 LAB — TROPONIN I (HIGH SENSITIVITY)
Troponin I (High Sensitivity): 15 ng/L (ref <18)
Troponin I (High Sensitivity): 73 ng/L — ABNORMAL HIGH (ref <18)
Troponin I (High Sensitivity): 387 ng/L (ref <18)

## 2023-08-03 LAB — LIPID PANEL
Cholesterol: 133 mg/dL (ref 0–200)
HDL: 31 mg/dL — ABNORMAL LOW (ref >40)
LDL Cholesterol: 73 mg/dL (ref 0–99)
Total CHOL/HDL Ratio: 4.3 ratio
Triglycerides: 146 mg/dL (ref <150)
VLDL: 29 mg/dL (ref 0–40)

## 2023-08-03 LAB — PROTIME-INR
INR: 1 (ref 0.8–1.2)
Prothrombin Time: 13.5 s (ref 11.4–15.2)

## 2023-08-03 LAB — LIPASE, BLOOD: Lipase: 30 U/L (ref 11–51)

## 2023-08-03 SURGERY — LEFT HEART CATH AND CORONARY ANGIOGRAPHY
Anesthesia: Moderate Sedation

## 2023-08-03 MED ORDER — HEPARIN (PORCINE) IN NACL 1000-0.9 UT/500ML-% IV SOLN
INTRAVENOUS | Status: AC
  Filled 2023-08-03: qty 1000

## 2023-08-03 MED ORDER — PRASUGREL HCL 10 MG PO TABS
ORAL_TABLET | ORAL | Status: AC
  Filled 2023-08-03: qty 6

## 2023-08-03 MED ORDER — LIDOCAINE HCL 1 % IJ SOLN
INTRAMUSCULAR | Status: AC
  Filled 2023-08-03: qty 20

## 2023-08-03 MED ORDER — ASPIRIN 81 MG PO CHEW: 81.0000 mg | CHEWABLE_TABLET | ORAL | Status: DC

## 2023-08-03 MED ORDER — HEPARIN (PORCINE) IN NACL 1000-0.9 UT/500ML-% IV SOLN
INTRAVENOUS | Status: DC | PRN
  Administered 2023-08-03: 1000 mL

## 2023-08-03 MED ORDER — FENTANYL CITRATE (PF) 100 MCG/2ML IJ SOLN
INTRAMUSCULAR | Status: AC
  Filled 2023-08-03: qty 2

## 2023-08-03 MED ORDER — ONDANSETRON HCL 4 MG/2ML IJ SOLN
4.0000 mg | Freq: Four times a day (QID) | INTRAMUSCULAR | Status: DC | PRN
  Administered 2023-08-03: 4 mg via INTRAVENOUS
  Filled 2023-08-03: qty 2

## 2023-08-03 MED ORDER — HYDROMORPHONE HCL 1 MG/ML IJ SOLN
1.0000 mg | Freq: Once | INTRAMUSCULAR | Status: AC
  Administered 2023-08-03: 1 mg via INTRAVENOUS
  Filled 2023-08-03: qty 1

## 2023-08-03 MED ORDER — HEPARIN SODIUM (PORCINE) 1000 UNIT/ML IJ SOLN
INTRAMUSCULAR | Status: AC
  Filled 2023-08-03: qty 10

## 2023-08-03 MED ORDER — SODIUM CHLORIDE 0.9 % WEIGHT BASED INFUSION
3.0000 mL/kg/h | INTRAVENOUS | Status: DC
Start: 2023-08-04 — End: 2023-08-03
  Administered 2023-08-03: 3 mL/kg/h via INTRAVENOUS

## 2023-08-03 MED ORDER — NITROGLYCERIN 1 MG/10 ML FOR IR/CATH LAB
INTRA_ARTERIAL | Status: AC
Start: 2023-08-03 — End: 2023-08-03
  Filled 2023-08-03: qty 10

## 2023-08-03 MED ORDER — MORPHINE SULFATE (PF) 4 MG/ML IV SOLN
2.0000 mg | INTRAVENOUS | Status: DC | PRN
  Administered 2023-08-03: 2 mg via INTRAVENOUS
  Filled 2023-08-03: qty 1

## 2023-08-03 MED ORDER — KETOROLAC TROMETHAMINE 30 MG/ML IJ SOLN
30.0000 mg | Freq: Once | INTRAMUSCULAR | Status: AC
  Administered 2023-08-03: 30 mg via INTRAVENOUS
  Filled 2023-08-03: qty 1

## 2023-08-03 MED ORDER — NITROGLYCERIN 0.4 MG SL SUBL: 0.4000 mg | SUBLINGUAL_TABLET | SUBLINGUAL | Status: DC | PRN

## 2023-08-03 MED ORDER — HYDRALAZINE HCL 20 MG/ML IJ SOLN
10.0000 mg | INTRAMUSCULAR | Status: AC | PRN
  Administered 2023-08-03: 10 mg via INTRAVENOUS
  Filled 2023-08-03: qty 1

## 2023-08-03 MED ORDER — INSULIN ASPART 100 UNIT/ML IJ SOLN
0.0000 [IU] | Freq: Three times a day (TID) | INTRAMUSCULAR | Status: DC
  Administered 2023-08-03: 3 [IU] via SUBCUTANEOUS
  Administered 2023-08-04: 2 [IU] via SUBCUTANEOUS
  Filled 2023-08-03 (×2): qty 1

## 2023-08-03 MED ORDER — HEPARIN (PORCINE) 25000 UT/250ML-% IV SOLN
1200.0000 [IU]/h | INTRAVENOUS | Status: DC
  Administered 2023-08-03: 1200 [IU]/h via INTRAVENOUS
  Filled 2023-08-03: qty 250

## 2023-08-03 MED ORDER — SODIUM CHLORIDE 0.9 % IV SOLN: INTRAVENOUS | Status: AC

## 2023-08-03 MED ORDER — ENOXAPARIN SODIUM 40 MG/0.4ML IJ SOSY
40.0000 mg | PREFILLED_SYRINGE | INTRAMUSCULAR | Status: DC
  Administered 2023-08-04: 40 mg via SUBCUTANEOUS
  Filled 2023-08-03 (×2): qty 0.4

## 2023-08-03 MED ORDER — IOHEXOL 350 MG/ML SOLN
100.0000 mL | Freq: Once | INTRAVENOUS | Status: AC | PRN
  Administered 2023-08-03: 100 mL via INTRAVENOUS

## 2023-08-03 MED ORDER — INSULIN ASPART 100 UNIT/ML IJ SOLN
0.0000 [IU] | Freq: Every day | INTRAMUSCULAR | Status: DC
  Administered 2023-08-03: 3 [IU] via SUBCUTANEOUS
  Filled 2023-08-03: qty 1

## 2023-08-03 MED ORDER — HEPARIN SODIUM (PORCINE) 1000 UNIT/ML IJ SOLN
INTRAMUSCULAR | Status: DC | PRN
  Administered 2023-08-03: 5000 [IU] via INTRAVENOUS
  Administered 2023-08-03: 7000 [IU] via INTRAVENOUS
  Administered 2023-08-03: 2000 [IU] via INTRAVENOUS
  Administered 2023-08-03: 4000 [IU] via INTRAVENOUS

## 2023-08-03 MED ORDER — PRASUGREL HCL 10 MG PO TABS
ORAL_TABLET | ORAL | Status: DC | PRN
  Administered 2023-08-03: 60 mg via ORAL

## 2023-08-03 MED ORDER — LIDOCAINE HCL (PF) 1 % IJ SOLN
INTRAMUSCULAR | Status: DC | PRN
  Administered 2023-08-03: 2 mL

## 2023-08-03 MED ORDER — SODIUM CHLORIDE 0.9 % WEIGHT BASED INFUSION: 1.0000 mL/kg/h | INTRAVENOUS | Status: DC

## 2023-08-03 MED ORDER — ASPIRIN 81 MG PO CHEW: 324.0000 mg | CHEWABLE_TABLET | ORAL | Status: DC

## 2023-08-03 MED ORDER — NITROGLYCERIN 1 MG/10 ML FOR IR/CATH LAB
INTRA_ARTERIAL | Status: DC | PRN
Start: 2023-08-03 — End: 2023-08-03
  Administered 2023-08-03: 200 ug via INTRACORONARY
  Administered 2023-08-03: 100 ug via INTRACORONARY

## 2023-08-03 MED ORDER — METOPROLOL TARTRATE 25 MG PO TABS
25.0000 mg | ORAL_TABLET | Freq: Two times a day (BID) | ORAL | Status: DC
  Administered 2023-08-03: 25 mg via ORAL
  Filled 2023-08-03: qty 1

## 2023-08-03 MED ORDER — MIDAZOLAM HCL 2 MG/2ML IJ SOLN
INTRAMUSCULAR | Status: DC | PRN
  Administered 2023-08-03 (×2): 1 mg via INTRAVENOUS

## 2023-08-03 MED ORDER — ATORVASTATIN CALCIUM 80 MG PO TABS
80.0000 mg | ORAL_TABLET | Freq: Every day | ORAL | Status: DC
Start: 2023-08-03 — End: 2023-08-04
  Administered 2023-08-03 – 2023-08-04 (×2): 80 mg via ORAL
  Filled 2023-08-03: qty 4
  Filled 2023-08-03: qty 1

## 2023-08-03 MED ORDER — SODIUM CHLORIDE 0.9 % IV SOLN
250.0000 mL | INTRAVENOUS | Status: DC | PRN
Start: 2023-08-03 — End: 2023-08-04

## 2023-08-03 MED ORDER — ASPIRIN 300 MG RE SUPP: 300.0000 mg | RECTAL | Status: DC

## 2023-08-03 MED ORDER — SODIUM CHLORIDE 0.9% FLUSH: 3.0000 mL | INTRAVENOUS | Status: DC | PRN

## 2023-08-03 MED ORDER — SODIUM CHLORIDE 0.9% FLUSH
3.0000 mL | Freq: Two times a day (BID) | INTRAVENOUS | Status: DC
  Administered 2023-08-03 (×2): 3 mL via INTRAVENOUS

## 2023-08-03 MED ORDER — VERAPAMIL HCL 2.5 MG/ML IV SOLN
INTRAVENOUS | Status: DC | PRN
  Administered 2023-08-03 (×2): 2.5 mg via INTRA_ARTERIAL

## 2023-08-03 MED ORDER — MORPHINE SULFATE (PF) 2 MG/ML IV SOLN
INTRAVENOUS | Status: AC
  Filled 2023-08-03: qty 1

## 2023-08-03 MED ORDER — PRASUGREL HCL 10 MG PO TABS
10.0000 mg | ORAL_TABLET | Freq: Every day | ORAL | Status: DC
  Administered 2023-08-04: 10 mg via ORAL
  Filled 2023-08-03: qty 1

## 2023-08-03 MED ORDER — LABETALOL HCL 5 MG/ML IV SOLN: 10.0000 mg | INTRAVENOUS | Status: AC | PRN

## 2023-08-03 MED ORDER — FENTANYL CITRATE (PF) 100 MCG/2ML IJ SOLN
INTRAMUSCULAR | Status: DC | PRN
  Administered 2023-08-03: 50 ug via INTRAVENOUS

## 2023-08-03 MED ORDER — MORPHINE SULFATE (PF) 4 MG/ML IV SOLN
4.0000 mg | Freq: Once | INTRAVENOUS | Status: AC
  Administered 2023-08-03: 4 mg via INTRAVENOUS
  Filled 2023-08-03: qty 1

## 2023-08-03 MED ORDER — LOSARTAN POTASSIUM 50 MG PO TABS
100.0000 mg | ORAL_TABLET | Freq: Every day | ORAL | Status: DC
  Administered 2023-08-04: 100 mg via ORAL
  Filled 2023-08-03: qty 2

## 2023-08-03 MED ORDER — INSULIN GLARGINE-YFGN 100 UNIT/ML ~~LOC~~ SOLN
65.0000 [IU] | SUBCUTANEOUS | Status: DC
  Administered 2023-08-03: 65 [IU] via SUBCUTANEOUS
  Filled 2023-08-03 (×2): qty 0.65

## 2023-08-03 MED ORDER — NITROGLYCERIN IN D5W 200-5 MCG/ML-% IV SOLN
0.0000 ug/min | INTRAVENOUS | Status: DC
  Administered 2023-08-03: 5 ug/min via INTRAVENOUS
  Filled 2023-08-03: qty 250

## 2023-08-03 MED ORDER — HEPARIN BOLUS VIA INFUSION
4000.0000 [IU] | Freq: Once | INTRAVENOUS | Status: AC
  Administered 2023-08-03: 4000 [IU] via INTRAVENOUS
  Filled 2023-08-03: qty 4000

## 2023-08-03 MED ORDER — IOHEXOL 300 MG/ML  SOLN
INTRAMUSCULAR | Status: DC | PRN
  Administered 2023-08-03: 130 mL

## 2023-08-03 MED ORDER — SODIUM CHLORIDE 0.9 % IV BOLUS
250.0000 mL | Freq: Once | INTRAVENOUS | Status: AC
  Administered 2023-08-03: 250 mL via INTRAVENOUS

## 2023-08-03 MED ORDER — ASPIRIN 81 MG PO TBEC
81.0000 mg | DELAYED_RELEASE_TABLET | Freq: Every day | ORAL | Status: DC
  Filled 2023-08-03: qty 1

## 2023-08-03 MED ORDER — ACETAMINOPHEN 325 MG PO TABS
650.0000 mg | ORAL_TABLET | ORAL | Status: DC | PRN
Start: 2023-08-03 — End: 2023-08-04

## 2023-08-03 MED ORDER — VERAPAMIL HCL 2.5 MG/ML IV SOLN
INTRAVENOUS | Status: AC
  Filled 2023-08-03: qty 2

## 2023-08-03 MED ORDER — MIDAZOLAM HCL 2 MG/2ML IJ SOLN
INTRAMUSCULAR | Status: AC
  Filled 2023-08-03: qty 2

## 2023-08-03 SURGICAL SUPPLY — 15 items
BALLOON TREK RX 2.25X12 (BALLOONS) IMPLANT
BALLOON ~~LOC~~ TREK NEO RX 3.0X12 (BALLOONS) IMPLANT
CATH INFINITI 5 FR JL3.5 (CATHETERS) IMPLANT
CATH INFINITI JR4 5F (CATHETERS) IMPLANT
CATH LAUNCHER 6FR EBU3.5 (CATHETERS) IMPLANT
DEVICE RAD TR BAND REGULAR (VASCULAR PRODUCTS) IMPLANT
DRAPE BRACHIAL (DRAPES) IMPLANT
GLIDESHEATH SLEND SS 6F .021 (SHEATH) IMPLANT
GUIDEWIRE INQWIRE 1.5J.035X260 (WIRE) IMPLANT
KIT ENCORE 26 ADVANTAGE (KITS) IMPLANT
PACK CARDIAC CATH (CUSTOM PROCEDURE TRAY) ×1 IMPLANT
SET ATX-X65L (MISCELLANEOUS) IMPLANT
STATION PROTECTION PRESSURIZED (MISCELLANEOUS) IMPLANT
STENT ONYX FRONTIER 2.75X15 (Permanent Stent) IMPLANT
STENT ONYX FRONTIER 3.0X08 (Permanent Stent) IMPLANT

## 2023-08-03 NOTE — Progress Notes (Signed)
 Pt. C/o severe nausea (post Morphine  and Hydralazing IV) : pt. Med. With Zofran  4 mg slow iVP now. States CP is about the same meaning a 2 or 3

## 2023-08-03 NOTE — H&P (View-Only) (Signed)
 Cardiology Consultation   Patient ID: [name] MRN: [id number]; DOB: [birth year]  Admit date: 08/03/2023 Date of Consult: 08/03/2023  PCP:  Kayla Jeoffrey RAMAN, FNP   [facility] Providers Cardiologist:  New        Patient Profile: Jeremiah Neal is a 53 y.o. male with a hx of hypertension, hyperlipidemia, type 2 diabetes mellitus, and polysubstance abuse, who is being seen 08/03/2023 for the evaluation of chest pain and elevated troponin at the request of Dr. Tobie.  History of Present Illness: Jeremiah Neal reports awakening around 3 AM with chest pain.  He initially thought it was indigestion and tried eating mustard in an effort to induce belching.  However, the chest pain did not improve and actually worsened, beginning to radiate to the left side and up to his neck and jaw.  He reports associated shortness of breath and diaphoresis.  Pain eventually intensified to 10+/10, prompting him to call EMS.  He received aspirin  and nitroglycerin  with minimal improvement in the pain.  In the ED, he was given morphine  and ketorolac  as well as IV nitroglycerin  with improvement in his chest pain.  Chest pain now comes and goes, 3/10 in intensity at the time of my evaluation.  He notes having had similar pain a few weeks ago though this resolved after about 30 minutes following Alka-Seltzer use.  He denies a history of heart disease.  [name] notes that he had been without his medications for blood pressure and diabetes for the last few days.  He also endorses using cocaine as recently as 3 days ago.  He also smokes cigarettes and marijuana intermittently.   Past Medical History:  Diagnosis Date   Diabetes mellitus without complication (HCC)    type 2   Diverticulitis    Hyperlipidemia    Hypertension     History reviewed. No pertinent surgical history.   Home Medications:  Prior to Admission medications   Medication Sig Start Date Tamyrah Burbage Date Taking? Authorizing Provider  acetaminophen   (TYLENOL ) 500 MG tablet Take 500 mg by mouth every 6 (six) hours as needed for mild pain.    [provider]  amLODipine  (NORVASC ) 10 MG tablet Take 1 tablet (10 mg total) by mouth daily. 04/06/23   Kayla Jeoffrey RAMAN, FNP  aspirin  EC 81 MG tablet Take 81 mg by mouth daily as needed for moderate pain. Swallow whole.    [provider]  atorvastatin  (LIPITOR ) 80 MG tablet Take 1 tablet (80 mg total) by mouth daily. 04/06/23   Kayla Jeoffrey RAMAN, FNP  BD PEN NEEDLE SHORT ULTRAFINE 31G X 8 MM MISC USE AS DIRECTED 07/27/23   Kayla Jeoffrey RAMAN, FNP  ezetimibe  (ZETIA ) 10 MG tablet Take 1 tablet (10 mg total) by mouth daily. Patient not taking: Reported on 07/13/2023 05/21/23   Kayla Jeoffrey RAMAN, FNP  gabapentin  (NEURONTIN ) 100 MG capsule Take 1-2 capsules (100-200 mg total) by mouth 3 (three) times daily. 04/27/23 07/26/23  Kayla Jeoffrey RAMAN, FNP  insulin  detemir (LEVEMIR  FLEXTOUCH) 100 UNIT/ML FlexPen Inject 65 Units into the skin daily. 05/22/23   Kayla Jeoffrey RAMAN, FNP  losartan  (COZAAR ) 100 MG tablet Take 1 tablet (100 mg total) by mouth daily. 04/06/23   Kayla Jeoffrey RAMAN, FNP  metFORMIN  (GLUCOPHAGE ) 1000 MG tablet Take 1 tablet (1,000 mg total) by mouth 2 (two) times daily with a meal. 04/06/23   Kayla Jeoffrey RAMAN, FNP  Semaglutide ,0.25 or 0.5MG /DOS, (OZEMPIC , 0.25 OR 0.5 MG/DOSE,) 2 MG/1.5ML SOPN Inject 0.25  mg into the skin once a week. Start with 0.25MG  once a week x 4 weeks, then increase to 0.5MG  weekly. 07/22/23   Kayla Jeoffrey RAMAN, FNP    Scheduled Meds:  ILDA Hold] aspirin   324 mg Oral NOW   Or   [MAR Hold] aspirin   300 mg Rectal NOW   [START ON 08/04/2023] aspirin   81 mg Oral Pre-Cath   [MAR Hold] aspirin  EC  81 mg Oral Daily   [MAR Hold] atorvastatin   80 mg Oral Daily   [MAR Hold] insulin  aspart  0-5 Units Subcutaneous QHS   [MAR Hold] insulin  aspart  0-9 Units Subcutaneous TID WC   [MAR Hold] metoprolol  tartrate  25 mg Oral BID   Continuous Infusions:  [START ON 08/04/2023] sodium chloride  3  mL/kg/hr (08/03/23 0955)   Followed by   NOREEN ON 08/04/2023] sodium chloride      heparin  1,200 Units/hr (08/03/23 0908)   nitroGLYCERIN  10 mcg/min (08/03/23 0901)   PRN Meds: [MAR Hold] acetaminophen , [MAR Hold] nitroGLYCERIN , [MAR Hold] ondansetron  (ZOFRAN ) IV  Allergies:    Allergies  Allergen Reactions   Penicillins Nausea Only    Has patient had a PCN reaction causing immediate rash, facial/tongue/throat swelling, SOB or lightheadedness with hypotension: No Has patient had a PCN reaction causing severe rash involving mucus membranes or skin necrosis: No Has patient had a PCN reaction that required hospitalization: No Has patient had a PCN reaction occurring within the last 10 years: Yes If all of the above answers are NO, then may proceed with Cephalosporin use.     Social History:   Social History   Tobacco Use   Smoking status: Some Days    Current packs/day: 1.00    Types: Cigarettes   Smokeless tobacco: Never  Vaping Use   Vaping status: Never Used  Substance Use Topics   Alcohol use: Yes    Comment: Fifth of liquor per week, usually on Fridays   Drug use: Yes    Types: Marijuana, Cocaine     Family History:   Patient believes his father had a heart attack.  ROS:  Please see the history of present illness. All other ROS reviewed and negative.     Physical Exam/Data: Vitals:   08/03/23 0830 08/03/23 0900 08/03/23 0910 08/03/23 0945  BP: 126/79 131/80  138/82  Pulse: 83 75  80  Resp: 11 15  20   Temp:   98.5 F (36.9 C) (!) 97.3 F (36.3 C)  TempSrc:   Oral Tympanic  SpO2: 97% 96%  93%  Weight:    116 kg  Height:    5' 10 (1.778 m)   No intake or output data in the 24 hours ending 08/03/23 0956    08/03/2023    9:45 AM 08/03/2023    8:27 AM 07/13/2023    2:51 PM  Last 3 Weights  Weight (lbs) 255 lb 11.7 oz 255 lb 11.7 oz 251 lb 12.8 oz  Weight (kg) 116 kg 116 kg 114.216 kg     Body mass index is 36.69 kg/m.  General:  Well nourished, well  developed, in no acute distress.  His girlfriend is at the bedside. HEENT: normal Neck: no JVD Vascular: Distal pulses 2+ bilaterally Cardiac:  normal S1, S2; RRR; no murmurs, rubs, or gallops. Lungs:  clear to auscultation bilaterally, no wheezing, rhonchi or rales  Abd: soft, nontender, no hepatomegaly  Ext: no edema Musculoskeletal:  No deformities, BUE and BLE strength normal and equal Skin: warm and dry  Neuro:  CNs 2-12 intact, no focal abnormalities noted Psych:  Normal affect   EKG:  The EKG was personally reviewed and demonstrates: Normal sinus rhythm with sinus arrhythmia and poor R wave progression. Telemetry:  Telemetry was personally reviewed and demonstrates: Normal sinus rhythm.  Relevant CV Studies: None available.  Laboratory Data: High Sensitivity Troponin:   Recent Labs  Lab 08/03/23 0516 08/03/23 0701 08/03/23 0907  TROPONINIHS 15 73* 387*     Chemistry Recent Labs  Lab 08/03/23 0516  NA 135  K 4.2  CL 100  CO2 25  GLUCOSE 261*  BUN 20  CREATININE 1.03  CALCIUM  8.9  GFRNONAA >60  ANIONGAP 10    Recent Labs  Lab 08/03/23 0516  PROT 6.5  ALBUMIN 3.7  AST 21  ALT 27  ALKPHOS 81  BILITOT 0.5   Lipids  Recent Labs  Lab 08/03/23 0900  CHOL 133  TRIG 146  HDL 31*  LDLCALC 73  CHOLHDL 4.3    Hematology Recent Labs  Lab 08/03/23 0516  WBC 14.4*  RBC 5.39  HGB 14.5  HCT 45.4  MCV 84.2  MCH 26.9  MCHC 31.9  RDW 14.7  PLT 253   Thyroid No results for input(s): TSH, FREET4 in the last 168 hours.  BNPNo results for input(s): BNP, PROBNP in the last 168 hours.  DDimer No results for input(s): DDIMER in the last 168 hours.  Radiology/Studies:  CT Angio Chest/Abd/Pel for Dissection W and/or Wo Contrast Result Date: 08/03/2023 CLINICAL DATA:  Chest pain.  Acute aortic syndrome suspected. EXAM: CT ANGIOGRAPHY CHEST, ABDOMEN AND PELVIS TECHNIQUE: Non-contrast CT of the chest was initially obtained. Multidetector CT imaging  through the chest, abdomen and pelvis was performed using the standard protocol during bolus administration of intravenous contrast. Multiplanar reconstructed images and MIPs were obtained and reviewed to evaluate the vascular anatomy. RADIATION DOSE REDUCTION: This exam was performed according to the departmental dose-optimization program which includes automated exposure control, adjustment of the mA and/or kV according to patient size and/or use of iterative reconstruction technique. CONTRAST:  OMNIPAQUE  IOHEXOL  350 MG/ML SOLN COMPARISON:  Abdomen pelvis CT 10/16/2022 FINDINGS: CTA CHEST FINDINGS Cardiovascular: No thoracic aortic aneurysm. No hyperdense crescent in the wall of the thoracic aorta to suggest the presence of an acute intramural hematoma. No dissection of the thoracic aorta. The heart size is normal. No substantial pericardial effusion. Aortic arch branch vessel anatomy is widely patent. Mediastinum/Nodes: No mediastinal lymphadenopathy. There is no hilar lymphadenopathy. The esophagus has normal imaging features. There is no axillary lymphadenopathy. Lungs/Pleura: Calcified granuloma noted left upper lobe. 6 mm anterior right lung nodule identified on 71/6 7 mm left lower lobe perifissural nodule identified on 104/6. 2 mm right upper lobe nodule identified on 63/6. 2 mm left lower lobe nodule identified on 75/6. These nodules are stable back to a study from 08/24/2017 consistent with benign etiology. No followup imaging is recommended. No focal airspace consolidation. No pleural effusion. Musculoskeletal: No worrisome lytic or sclerotic osseous abnormality. Review of the MIP images confirms the above findings. CTA ABDOMEN AND PELVIS FINDINGS VASCULAR Aorta: Normal caliber aorta without aneurysm, dissection, vasculitis or significant stenosis. Mild to moderate atherosclerosis. Celiac: Patent without evidence of aneurysm, dissection, vasculitis or significant stenosis. SMA: Patent without  evidence of aneurysm, dissection, vasculitis or significant stenosis. Renals: Both renal arteries are patent without evidence of aneurysm, dissection, vasculitis, fibromuscular dysplasia or significant stenosis. IMA: Patent without evidence of aneurysm, dissection, vasculitis or significant stenosis. Inflow: Patent  without evidence of aneurysm, dissection, vasculitis or significant stenosis. Veins: No obvious venous abnormality within the limitations of this arterial phase study. Review of the MIP images confirms the above findings. NON-VASCULAR Hepatobiliary: No suspicious focal abnormality within the liver parenchyma. There is no evidence for gallstones, gallbladder wall thickening, or pericholecystic fluid. No intrahepatic or extrahepatic biliary dilation. Pancreas: No focal mass lesion. No dilatation of the main duct. No intraparenchymal cyst. No peripancreatic edema. Spleen: No splenomegaly. No suspicious focal mass lesion. Adrenals/Urinary Tract: Nodular thickening of both adrenal glands is stable since 10/16/2022. Kidneys unremarkable. No evidence for hydroureter. The urinary bladder appears normal for the degree of distention. Stomach/Bowel: Stomach is unremarkable. No gastric wall thickening. No evidence of outlet obstruction. Duodenum is normally positioned as is the ligament of Treitz. No small bowel wall thickening. No small bowel dilatation. The terminal ileum is normal. The appendix is normal. No gross colonic mass. No colonic wall thickening. Diverticular changes are noted in the left colon without evidence of diverticulitis. Lymphatic: There is no gastrohepatic or hepatoduodenal ligament lymphadenopathy. No retroperitoneal or mesenteric lymphadenopathy. No pelvic sidewall lymphadenopathy. Reproductive: The prostate gland and seminal vesicles are unremarkable. Large right hydrocele evident. Other: No intraperitoneal free fluid. Musculoskeletal: No worrisome lytic or sclerotic osseous abnormality.  Review of the MIP images confirms the above findings. IMPRESSION: 1. No evidence for thoracic or abdominal aortic aneurysm or dissection. 2. No acute findings in the chest, abdomen, or pelvis. 3. Large right hydrocele. 4. Left colonic diverticulosis without diverticulitis. 5.  Aortic Atherosclerosis (ICD10-I70.0). Electronically Signed   By: Camellia Candle M.D.   On: 08/03/2023 06:15     Assessment and Plan: NSTEMI: Patient presents with sudden onset of chest pain at 3 AM, improved with nitrates, morphine , and ketorolac , though pain continues to wax and wane.  EKG does not show any acute ischemic changes, though high-sensitivity troponin I has continued to trend up (15->73->387), consistent with NSTEMI.  Given his continued chest pain, I agree with escalation of IV nitroglycerin  as tolerated.  IV heparin  has also been ordered.  We will proceed with expedited cardiac catheterization and possible PCI.  Continue ezetimibe  and atorvastatin , though LDL just above goal (question compliance as patient endorses having missed medications over the last few days).  Hypertension: Blood pressure adequately controlled at this time on nitroglycerin .  Further recommendations to follow catheterization.  Hyperlipidemia associated with type 2 diabetes mellitus: Continue atorvastatin  80 mg daily and ezetimibe  10 mg daily for target LDL less than 70.  Ongoing management of DM per primary team.  Polysubstance abuse: Patient smokes tobacco and marijuana, intermittently consumes quite a bit of alcohol (1/5 most Fridays), and also snorts cocaine as recently as 3 days ago.  Importance of avoidance of these substances was reinforced.  Informed Consent   Shared Decision Making/Informed Consent The risks [stroke (1 in 1000), death (1 in 1000), kidney failure [usually temporary] (1 in 500), bleeding (1 in 200), allergic reaction [possibly serious] (1 in 200)], benefits (diagnostic support and management of coronary artery  disease) and alternatives of a cardiac catheterization were discussed in detail with Mr. Ollinger and he is willing to proceed.     Risk Assessment/Risk Scores:    TIMI Risk Score for Unstable Angina or Non-ST Elevation MI:   The patient's TIMI risk score is 4, which indicates a 20% risk of all cause mortality, new or recurrent myocardial infarction or need for urgent revascularization in the next 14 days.    For questions or updates,  please contact [facility] Please consult [email or web address] for contact info under [facility] Cardiology.  Signed, Lonni Hanson, MD  08/03/2023 9:56 AM

## 2023-08-03 NOTE — H&P (Signed)
 History and Physical    Patient: Jeremiah Neal FMW:[id number] DOB: February 11, 1970 DOA: 08/03/2023 DOS: the patient was seen and examined on 08/03/2023 PCP: Kayla Jeoffrey RAMAN, FNP  Patient coming from: Home  Chief Complaint:  Chief Complaint  Patient presents with   Chest Pain   HPI: Jeremiah Neal is a 53 y.o. male with medical history significant of hypertension, diabetes, hyperlipidemia comes to the emergency room accompanied by his wife with complaints of chest pain left-sided radiating to the jaw and becoming diaphoretic on and off since 3 o'clock this morning which woke him up. Patient said he is been having these episodes last few weeks but resolved however continued today decided to come to the ER. Initial EKG and ER did not show any ST elevation. First troponin was negative. Second troponin was 73. Patient was started on nitroglycerin  drip and heparin  drip by ER MD. Patient received couple doses of morphine  in the ER cardiology consultation was made with Dr. and. Patient underwent cardiac catheterization. Patient is being admitted with non-STEMI.  Review of Systems: As mentioned in the history of present illness. All other systems reviewed and are negative. Past Medical History:  Diagnosis Date   Diabetes mellitus without complication (HCC)    type 2   Diverticulitis    Hyperlipidemia    Hypertension    History reviewed. No pertinent surgical history. Social History:  reports that he has been smoking cigarettes. He has never used smokeless tobacco. He reports current alcohol use. He reports current drug use. Drugs: Marijuana and Cocaine.  Allergies  Allergen Reactions   Penicillins Nausea Only    Has patient had a PCN reaction causing immediate rash, facial/tongue/throat swelling, SOB or lightheadedness with hypotension: No Has patient had a PCN reaction causing severe rash involving mucus membranes or skin necrosis: No Has patient had a PCN reaction that required hospitalization:  No Has patient had a PCN reaction occurring within the last 10 years: Yes If all of the above answers are NO, then may proceed with Cephalosporin use.     Family History  Problem Relation Age of Onset   Heart attack Father     Prior to Admission medications   Medication Sig Start Date End Date Taking? Authorizing Provider  acetaminophen  (TYLENOL ) 500 MG tablet Take 500 mg by mouth every 6 (six) hours as needed for mild pain.    [provider]  amLODipine  (NORVASC ) 10 MG tablet Take 1 tablet (10 mg total) by mouth daily. 04/06/23   Kayla Jeoffrey RAMAN, FNP  aspirin  EC 81 MG tablet Take 81 mg by mouth daily as needed for moderate pain. Swallow whole.    [provider]  atorvastatin  (LIPITOR ) 80 MG tablet Take 1 tablet (80 mg total) by mouth daily. 04/06/23   Kayla Jeoffrey RAMAN, FNP  BD PEN NEEDLE SHORT ULTRAFINE 31G X 8 MM MISC USE AS DIRECTED 07/27/23   Kayla Jeoffrey RAMAN, FNP  ezetimibe  (ZETIA ) 10 MG tablet Take 1 tablet (10 mg total) by mouth daily. Patient not taking: Reported on 07/13/2023 05/21/23   Kayla Jeoffrey RAMAN, FNP  gabapentin  (NEURONTIN ) 100 MG capsule Take 1-2 capsules (100-200 mg total) by mouth 3 (three) times daily. 04/27/23 07/26/23  Kayla Jeoffrey RAMAN, FNP  insulin  detemir (LEVEMIR  FLEXTOUCH) 100 UNIT/ML FlexPen Inject 65 Units into the skin daily. 05/22/23   Kayla Jeoffrey RAMAN, FNP  losartan  (COZAAR ) 100 MG tablet Take 1 tablet (100 mg total) by mouth daily. 04/06/23   Kayla Jeoffrey RAMAN, FNP  metFORMIN  (GLUCOPHAGE ) 1000 MG tablet Take 1 tablet (1,000 mg total) by mouth 2 (two) times daily with a meal. 04/06/23   Kayla Jeoffrey RAMAN, FNP  Semaglutide ,0.25 or 0.5MG /DOS, (OZEMPIC , 0.25 OR 0.5 MG/DOSE,) 2 MG/1.5ML SOPN Inject 0.25 mg into the skin once a week. Start with 0.25MG  once a week x 4 weeks, then increase to 0.5MG  weekly. 07/22/23   Kayla Jeoffrey RAMAN, FNP    Physical Exam: Vitals:   08/03/23 1230 08/03/23 1245 08/03/23 1300 08/03/23 1301  BP: (!) 124/98 91/74 (!) 84/64 (!) 88/69   Pulse: 70 64 61 63  Resp: 15 12 12 17   Temp:      TempSrc:      SpO2: 92% 94% 97% 95%  Weight:      Height:      Physical Exam Constitutional:      Appearance: He is well-developed.  Eyes:     Pupils: Pupils are equal, round, and reactive to light.  Cardiovascular:     Rate and Rhythm: Normal rate and regular rhythm.     Heart sounds: Normal heart sounds.  Pulmonary:     Breath sounds: Normal breath sounds.  Abdominal:     Palpations: Abdomen is soft.  Skin:    General: Skin is warm and dry.  Neurological:     Mental Status: He is alert.       Assessment and Plan:   Jeremiah Neal is a 53 y.o. male with medical history significant of hypertension, diabetes, hyperlipidemia comes to the emergency room accompanied by his wife with complaints of chest pain left-sided radiating to the jaw and becoming diaphoretic on and off since 3 o'clock this morning which woke him up.  NSTEMI -- risk factors obesity, uncontrolled diabetes and hypertension along with hyperlipidemia -- cardiology consultation with [facility] cardiology Dr. Mady -- patient initially was started on IV heparin  drip and nitroglycerin  drip. He underwent emergent cardiac cath and had stent placement in the mid left circumflex. -- Now on aspirin  and Effient  -- continue statins, zetia   Hypertension -- follow cardiology recommendation  Hyperlipidemia -- statins and Zetia   Type II diabetes, hyperglycemia, uncontrolled, intermittent noncompliance to meds -- hemoglobin A1c 9.9 -- resume long-acting insulin  and sliding scale -- patient also takes metformin  at home will hold off since he had cath for 48 hours  Polysubstance use -- smokes tobacco and marijuana -- recommend cessation       Advance Care Planning:   Code Status: Full Code   Consults: [facility] cardi[facility]  Family Communication: wife at bedside  Severity of Illness: The appropriate patient status for this patient is INPATIENT. Inpatient status is judged  to be reasonable and necessary in order to provide the required intensity of service to ensure the patient's safety. The patient's presenting symptoms, physical exam findings, and initial radiographic and laboratory data in the context of their chronic comorbidities is felt to place them at high risk for further clinical deterioration. Furthermore, it is not anticipated that the patient will be medically stable for discharge from the hospital within 2 midnights of admission.   * I certify that at the point of admission it is my clinical judgment that the patient will require inpatient hospital care spanning beyond 2 midnights from the point of admission due to high intensity of service, high risk for further deterioration and high frequency of surveillance required.*  Author: Leita Blanch, MD 08/03/2023 2:09 PM  For on call review [email or web address].

## 2023-08-03 NOTE — ED Triage Notes (Signed)
 Patient reports chest pain that started at 3am. Hx of hypertension and DM.  Nitro paste, 1 inch given in route, [id number]

## 2023-08-03 NOTE — Progress Notes (Signed)
 Pt. Had 3 low BP readings of 89/60's. MD alerted. NS bolus began per order. Message to ECHO tech. To do ECHO portable before tx to floor.

## 2023-08-03 NOTE — Progress Notes (Signed)
 Right wrist radial site bled approx. 5 min. After TR band off. Manual pressure held x 10 min. By RN. Sterile 2x2 and Tegaderm applied to site. NS bolus completed. Pt. Denies any c/o CP, SOB, N/V. Pt. Sat up to void now.

## 2023-08-03 NOTE — Consult Note (Signed)
 Cardiology Consultation   Patient ID: [name] MRN: [id number]; DOB: [birth year]  Admit date: 08/03/2023 Date of Consult: 08/03/2023  PCP:  Kayla Jeoffrey RAMAN, FNP   [facility] Providers Cardiologist:  New        Patient Profile: Jeremiah Neal is a 53 y.o. male with a hx of hypertension, hyperlipidemia, type 2 diabetes mellitus, and polysubstance abuse, who is being seen 08/03/2023 for the evaluation of chest pain and elevated troponin at the request of Dr. Tobie.  History of Present Illness: Jeremiah Neal reports awakening around 3 AM with chest pain.  He initially thought it was indigestion and tried eating mustard in an effort to induce belching.  However, the chest pain did not improve and actually worsened, beginning to radiate to the left side and up to his neck and jaw.  He reports associated shortness of breath and diaphoresis.  Pain eventually intensified to 10+/10, prompting him to call EMS.  He received aspirin  and nitroglycerin  with minimal improvement in the pain.  In the ED, he was given morphine  and ketorolac  as well as IV nitroglycerin  with improvement in his chest pain.  Chest pain now comes and goes, 3/10 in intensity at the time of my evaluation.  He notes having had similar pain a few weeks ago though this resolved after about 30 minutes following Alka-Seltzer use.  He denies a history of heart disease.  Jeremiah Neal notes that he had been without his medications for blood pressure and diabetes for the last few days.  He also endorses using cocaine as recently as 3 days ago.  He also smokes cigarettes and marijuana intermittently.   Past Medical History:  Diagnosis Date   Diabetes mellitus without complication (HCC)    type 2   Diverticulitis    Hyperlipidemia    Hypertension     History reviewed. No pertinent surgical history.   Home Medications:  Prior to Admission medications   Medication Sig Start Date Tamyrah Burbage Date Taking? Authorizing Provider  acetaminophen   (TYLENOL ) 500 MG tablet Take 500 mg by mouth every 6 (six) hours as needed for mild pain.    [provider]  amLODipine  (NORVASC ) 10 MG tablet Take 1 tablet (10 mg total) by mouth daily. 04/06/23   Kayla Jeoffrey RAMAN, FNP  aspirin  EC 81 MG tablet Take 81 mg by mouth daily as needed for moderate pain. Swallow whole.    [provider]  atorvastatin  (LIPITOR ) 80 MG tablet Take 1 tablet (80 mg total) by mouth daily. 04/06/23   Kayla Jeoffrey RAMAN, FNP  BD PEN NEEDLE SHORT ULTRAFINE 31G X 8 MM MISC USE AS DIRECTED 07/27/23   Kayla Jeoffrey RAMAN, FNP  ezetimibe  (ZETIA ) 10 MG tablet Take 1 tablet (10 mg total) by mouth daily. Patient not taking: Reported on 07/13/2023 05/21/23   Kayla Jeoffrey RAMAN, FNP  gabapentin  (NEURONTIN ) 100 MG capsule Take 1-2 capsules (100-200 mg total) by mouth 3 (three) times daily. 04/27/23 07/26/23  Kayla Jeoffrey RAMAN, FNP  insulin  detemir (LEVEMIR  FLEXTOUCH) 100 UNIT/ML FlexPen Inject 65 Units into the skin daily. 05/22/23   Kayla Jeoffrey RAMAN, FNP  losartan  (COZAAR ) 100 MG tablet Take 1 tablet (100 mg total) by mouth daily. 04/06/23   Kayla Jeoffrey RAMAN, FNP  metFORMIN  (GLUCOPHAGE ) 1000 MG tablet Take 1 tablet (1,000 mg total) by mouth 2 (two) times daily with a meal. 04/06/23   Kayla Jeoffrey RAMAN, FNP  Semaglutide ,0.25 or 0.5MG /DOS, (OZEMPIC , 0.25 OR 0.5 MG/DOSE,) 2 MG/1.5ML SOPN Inject 0.25  mg into the skin once a week. Start with 0.25MG  once a week x 4 weeks, then increase to 0.5MG  weekly. 07/22/23   Kayla Jeoffrey RAMAN, FNP    Scheduled Meds:  ILDA Hold] aspirin   324 mg Oral NOW   Or   [MAR Hold] aspirin   300 mg Rectal NOW   [START ON 08/04/2023] aspirin   81 mg Oral Pre-Cath   [MAR Hold] aspirin  EC  81 mg Oral Daily   [MAR Hold] atorvastatin   80 mg Oral Daily   [MAR Hold] insulin  aspart  0-5 Units Subcutaneous QHS   [MAR Hold] insulin  aspart  0-9 Units Subcutaneous TID WC   [MAR Hold] metoprolol  tartrate  25 mg Oral BID   Continuous Infusions:  [START ON 08/04/2023] sodium chloride  3  mL/kg/hr (08/03/23 0955)   Followed by   NOREEN ON 08/04/2023] sodium chloride      heparin  1,200 Units/hr (08/03/23 0908)   nitroGLYCERIN  10 mcg/min (08/03/23 0901)   PRN Meds: [MAR Hold] acetaminophen , [MAR Hold] nitroGLYCERIN , [MAR Hold] ondansetron  (ZOFRAN ) IV  Allergies:    Allergies  Allergen Reactions   Penicillins Nausea Only    Has patient had a PCN reaction causing immediate rash, facial/tongue/throat swelling, SOB or lightheadedness with hypotension: No Has patient had a PCN reaction causing severe rash involving mucus membranes or skin necrosis: No Has patient had a PCN reaction that required hospitalization: No Has patient had a PCN reaction occurring within the last 10 years: Yes If all of the above answers are NO, then may proceed with Cephalosporin use.     Social History:   Social History   Tobacco Use   Smoking status: Some Days    Current packs/day: 1.00    Types: Cigarettes   Smokeless tobacco: Never  Vaping Use   Vaping status: Never Used  Substance Use Topics   Alcohol use: Yes    Comment: Fifth of liquor per week, usually on Fridays   Drug use: Yes    Types: Marijuana, Cocaine     Family History:   Patient believes his father had a heart attack.  ROS:  Please see the history of present illness. All other ROS reviewed and negative.     Physical Exam/Data: Vitals:   08/03/23 0830 08/03/23 0900 08/03/23 0910 08/03/23 0945  BP: 126/79 131/80  138/82  Pulse: 83 75  80  Resp: 11 15  20   Temp:   98.5 F (36.9 C) (!) 97.3 F (36.3 C)  TempSrc:   Oral Tympanic  SpO2: 97% 96%  93%  Weight:    116 kg  Height:    5' 10 (1.778 m)   No intake or output data in the 24 hours ending 08/03/23 0956    08/03/2023    9:45 AM 08/03/2023    8:27 AM 07/13/2023    2:51 PM  Last 3 Weights  Weight (lbs) 255 lb 11.7 oz 255 lb 11.7 oz 251 lb 12.8 oz  Weight (kg) 116 kg 116 kg 114.216 kg     Body mass index is 36.69 kg/m.  General:  Well nourished, well  developed, in no acute distress.  His girlfriend is at the bedside. HEENT: normal Neck: no JVD Vascular: Distal pulses 2+ bilaterally Cardiac:  normal S1, S2; RRR; no murmurs, rubs, or gallops. Lungs:  clear to auscultation bilaterally, no wheezing, rhonchi or rales  Abd: soft, nontender, no hepatomegaly  Ext: no edema Musculoskeletal:  No deformities, BUE and BLE strength normal and equal Skin: warm and dry  Neuro:  CNs 2-12 intact, no focal abnormalities noted Psych:  Normal affect   EKG:  The EKG was personally reviewed and demonstrates: Normal sinus rhythm with sinus arrhythmia and poor R wave progression. Telemetry:  Telemetry was personally reviewed and demonstrates: Normal sinus rhythm.  Relevant CV Studies: None available.  Laboratory Data: High Sensitivity Troponin:   Recent Labs  Lab 08/03/23 0516 08/03/23 0701 08/03/23 0907  TROPONINIHS 15 73* 387*     Chemistry Recent Labs  Lab 08/03/23 0516  NA 135  K 4.2  CL 100  CO2 25  GLUCOSE 261*  BUN 20  CREATININE 1.03  CALCIUM  8.9  GFRNONAA >60  ANIONGAP 10    Recent Labs  Lab 08/03/23 0516  PROT 6.5  ALBUMIN 3.7  AST 21  ALT 27  ALKPHOS 81  BILITOT 0.5   Lipids  Recent Labs  Lab 08/03/23 0900  CHOL 133  TRIG 146  HDL 31*  LDLCALC 73  CHOLHDL 4.3    Hematology Recent Labs  Lab 08/03/23 0516  WBC 14.4*  RBC 5.39  HGB 14.5  HCT 45.4  MCV 84.2  MCH 26.9  MCHC 31.9  RDW 14.7  PLT 253   Thyroid No results for input(s): TSH, FREET4 in the last 168 hours.  BNPNo results for input(s): BNP, PROBNP in the last 168 hours.  DDimer No results for input(s): DDIMER in the last 168 hours.  Radiology/Studies:  CT Angio Chest/Abd/Pel for Dissection W and/or Wo Contrast Result Date: 08/03/2023 CLINICAL DATA:  Chest pain.  Acute aortic syndrome suspected. EXAM: CT ANGIOGRAPHY CHEST, ABDOMEN AND PELVIS TECHNIQUE: Non-contrast CT of the chest was initially obtained. Multidetector CT imaging  through the chest, abdomen and pelvis was performed using the standard protocol during bolus administration of intravenous contrast. Multiplanar reconstructed images and MIPs were obtained and reviewed to evaluate the vascular anatomy. RADIATION DOSE REDUCTION: This exam was performed according to the departmental dose-optimization program which includes automated exposure control, adjustment of the mA and/or kV according to patient size and/or use of iterative reconstruction technique. CONTRAST:  OMNIPAQUE  IOHEXOL  350 MG/ML SOLN COMPARISON:  Abdomen pelvis CT 10/16/2022 FINDINGS: CTA CHEST FINDINGS Cardiovascular: No thoracic aortic aneurysm. No hyperdense crescent in the wall of the thoracic aorta to suggest the presence of an acute intramural hematoma. No dissection of the thoracic aorta. The heart size is normal. No substantial pericardial effusion. Aortic arch branch vessel anatomy is widely patent. Mediastinum/Nodes: No mediastinal lymphadenopathy. There is no hilar lymphadenopathy. The esophagus has normal imaging features. There is no axillary lymphadenopathy. Lungs/Pleura: Calcified granuloma noted left upper lobe. 6 mm anterior right lung nodule identified on 71/6 7 mm left lower lobe perifissural nodule identified on 104/6. 2 mm right upper lobe nodule identified on 63/6. 2 mm left lower lobe nodule identified on 75/6. These nodules are stable back to a study from 08/24/2017 consistent with benign etiology. No followup imaging is recommended. No focal airspace consolidation. No pleural effusion. Musculoskeletal: No worrisome lytic or sclerotic osseous abnormality. Review of the MIP images confirms the above findings. CTA ABDOMEN AND PELVIS FINDINGS VASCULAR Aorta: Normal caliber aorta without aneurysm, dissection, vasculitis or significant stenosis. Mild to moderate atherosclerosis. Celiac: Patent without evidence of aneurysm, dissection, vasculitis or significant stenosis. SMA: Patent without  evidence of aneurysm, dissection, vasculitis or significant stenosis. Renals: Both renal arteries are patent without evidence of aneurysm, dissection, vasculitis, fibromuscular dysplasia or significant stenosis. IMA: Patent without evidence of aneurysm, dissection, vasculitis or significant stenosis. Inflow: Patent  without evidence of aneurysm, dissection, vasculitis or significant stenosis. Veins: No obvious venous abnormality within the limitations of this arterial phase study. Review of the MIP images confirms the above findings. NON-VASCULAR Hepatobiliary: No suspicious focal abnormality within the liver parenchyma. There is no evidence for gallstones, gallbladder wall thickening, or pericholecystic fluid. No intrahepatic or extrahepatic biliary dilation. Pancreas: No focal mass lesion. No dilatation of the main duct. No intraparenchymal cyst. No peripancreatic edema. Spleen: No splenomegaly. No suspicious focal mass lesion. Adrenals/Urinary Tract: Nodular thickening of both adrenal glands is stable since 10/16/2022. Kidneys unremarkable. No evidence for hydroureter. The urinary bladder appears normal for the degree of distention. Stomach/Bowel: Stomach is unremarkable. No gastric wall thickening. No evidence of outlet obstruction. Duodenum is normally positioned as is the ligament of Treitz. No small bowel wall thickening. No small bowel dilatation. The terminal ileum is normal. The appendix is normal. No gross colonic mass. No colonic wall thickening. Diverticular changes are noted in the left colon without evidence of diverticulitis. Lymphatic: There is no gastrohepatic or hepatoduodenal ligament lymphadenopathy. No retroperitoneal or mesenteric lymphadenopathy. No pelvic sidewall lymphadenopathy. Reproductive: The prostate gland and seminal vesicles are unremarkable. Large right hydrocele evident. Other: No intraperitoneal free fluid. Musculoskeletal: No worrisome lytic or sclerotic osseous abnormality.  Review of the MIP images confirms the above findings. IMPRESSION: 1. No evidence for thoracic or abdominal aortic aneurysm or dissection. 2. No acute findings in the chest, abdomen, or pelvis. 3. Large right hydrocele. 4. Left colonic diverticulosis without diverticulitis. 5.  Aortic Atherosclerosis (ICD10-I70.0). Electronically Signed   By: Camellia Candle M.D.   On: 08/03/2023 06:15     Assessment and Plan: NSTEMI: Patient presents with sudden onset of chest pain at 3 AM, improved with nitrates, morphine , and ketorolac , though pain continues to wax and wane.  EKG does not show any acute ischemic changes, though high-sensitivity troponin I has continued to trend up (15->73->387), consistent with NSTEMI.  Given his continued chest pain, I agree with escalation of IV nitroglycerin  as tolerated.  IV heparin  has also been ordered.  We will proceed with expedited cardiac catheterization and possible PCI.  Continue ezetimibe  and atorvastatin , though LDL just above goal (question compliance as patient endorses having missed medications over the last few days).  Hypertension: Blood pressure adequately controlled at this time on nitroglycerin .  Further recommendations to follow catheterization.  Hyperlipidemia associated with type 2 diabetes mellitus: Continue atorvastatin  80 mg daily and ezetimibe  10 mg daily for target LDL less than 70.  Ongoing management of DM per primary team.  Polysubstance abuse: Patient smokes tobacco and marijuana, intermittently consumes quite a bit of alcohol (1/5 most Fridays), and also snorts cocaine as recently as 3 days ago.  Importance of avoidance of these substances was reinforced.  Informed Consent   Shared Decision Making/Informed Consent The risks [stroke (1 in 1000), death (1 in 1000), kidney failure [usually temporary] (1 in 500), bleeding (1 in 200), allergic reaction [possibly serious] (1 in 200)], benefits (diagnostic support and management of coronary artery  disease) and alternatives of a cardiac catheterization were discussed in detail with Mr. Ollinger and he is willing to proceed.     Risk Assessment/Risk Scores:    TIMI Risk Score for Unstable Angina or Non-ST Elevation MI:   The patient's TIMI risk score is 4, which indicates a 20% risk of all cause mortality, new or recurrent myocardial infarction or need for urgent revascularization in the next 14 days.    For questions or updates,  please contact [facility] Please consult [email or web address] for contact info under [facility]iology.  Signed, Lonni Hanson, MD  08/03/2023 9:56 AM

## 2023-08-03 NOTE — Brief Op Note (Signed)
 BRIEF CARDIAC CATHETERIZATION NOTE  DATE: 08/03/2023  TIME: 11:40 AM  PATIENT:  Jeremiah Neal  53 y.o. male  PRE-OPERATIVE DIAGNOSIS:  NSTEMI  POST-OPERATIVE DIAGNOSIS:  NSTEMI  PROCEDURE:  Procedure(s): LEFT HEART CATH AND CORONARY ANGIOGRAPHY (N/A) CORONARY STENT INTERVENTION (N/A)  SURGEON:  Surgeons and Role:    * Derell Bruun, MD - Primary  FINDINGS: Severe single-vessel CAD with 99% mid LCx stenosis with TIMI-1 flow.  Non-obstructive LAD/RCA disease. Mildly elevated LVEDP with mildly reduced LVEF. Successful PCI to proximal/mid LCx with two overlapping DES (Onyx Frontier 3.0 x 8 mm and 2.75 x 15 mm) with 0% residual stenosis and TIMI-3 flow.  RECOMMENDATIONS: Overnight observation. Follow-up echocardiogram. DAPT with aspirin  and prasugrel  for at least 12 months.  Lonni Hanson, MD [facility]

## 2023-08-03 NOTE — ED Notes (Signed)
 Pt in CT with this RN and Art therapist. Per Dr. Neomi CT Angio Dissection to be done without waiting for labs to result.

## 2023-08-03 NOTE — Progress Notes (Signed)
 ECHO completed

## 2023-08-03 NOTE — ED Provider Notes (Signed)
 [facility] Provider Note    Event Date/Time   First MD Initiated Contact with Patient 08/03/23 [phone]     (approximate)   History   Chest Pain   HPI  Jeremiah Neal is a 53 y.o. male with history of hypertension, diabetes, hyperlipidemia who presents to the emergency department with sudden onset left-sided tight chest pain that radiates into his jaw that started at 3 AM while sleeping.  States it woke him from sleep.  He has had associated shortness of breath but no nausea, vomiting, diaphoresis, dizziness, fever, cough.  No history of PE or DVT.  No calf tenderness or calf swelling.  Patient is moaning in pain.  Received 324 mg of aspirin  with the fire department.  Received an inch of Nitropaste with EMS.  EKG showed sinus tachycardia with EMS.  Blood glucose 197.  Initial blood pressure was in the 160 systolic but improved to the 140s after Nitropaste.  Patient denies any improvement in pain.   History provided by patient, EMS.    Past Medical History:  Diagnosis Date   Diabetes mellitus without complication (HCC)    type 2   Diverticulitis    Hyperlipidemia    Hypertension     History reviewed. No pertinent surgical history.  MEDICATIONS:  Prior to Admission medications   Medication Sig Start Date End Date Taking? Authorizing Provider  acetaminophen  (TYLENOL ) 500 MG tablet Take 500 mg by mouth every 6 (six) hours as needed for mild pain.    [provider]  amLODipine  (NORVASC ) 10 MG tablet Take 1 tablet (10 mg total) by mouth daily. 04/06/23   Kayla Jeoffrey RAMAN, FNP  aspirin  EC 81 MG tablet Take 81 mg by mouth daily as needed for moderate pain. Swallow whole.    [provider]  atorvastatin  (LIPITOR ) 80 MG tablet Take 1 tablet (80 mg total) by mouth daily. 04/06/23   Kayla Jeoffrey RAMAN, FNP  BD PEN NEEDLE SHORT ULTRAFINE 31G X 8 MM MISC USE AS DIRECTED 07/27/23   Kayla Jeoffrey RAMAN, FNP  ezetimibe  (ZETIA ) 10 MG tablet Take 1 tablet (10 mg  total) by mouth daily. Patient not taking: Reported on 07/13/2023 05/21/23   Kayla Jeoffrey RAMAN, FNP  gabapentin  (NEURONTIN ) 100 MG capsule Take 1-2 capsules (100-200 mg total) by mouth 3 (three) times daily. 04/27/23 07/26/23  Kayla Jeoffrey RAMAN, FNP  insulin  detemir (LEVEMIR  FLEXTOUCH) 100 UNIT/ML FlexPen Inject 65 Units into the skin daily. 05/22/23   Kayla Jeoffrey RAMAN, FNP  losartan  (COZAAR ) 100 MG tablet Take 1 tablet (100 mg total) by mouth daily. 04/06/23   Kayla Jeoffrey RAMAN, FNP  metFORMIN  (GLUCOPHAGE ) 1000 MG tablet Take 1 tablet (1,000 mg total) by mouth 2 (two) times daily with a meal. 04/06/23   Kayla Jeoffrey RAMAN, FNP  Semaglutide ,0.25 or 0.5MG /DOS, (OZEMPIC , 0.25 OR 0.5 MG/DOSE,) 2 MG/1.5ML SOPN Inject 0.25 mg into the skin once a week. Start with 0.25MG  once a week x 4 weeks, then increase to 0.5MG  weekly. 07/22/23   Kayla Jeoffrey RAMAN, FNP    Physical Exam   Triage Vital Signs: ED Triage Vitals [08/03/23 0511]  Encounter Vitals Group     BP      Girls Systolic BP Percentile      Girls Diastolic BP Percentile      Boys Systolic BP Percentile      Boys Diastolic BP Percentile      Pulse      Resp  Temp      Temp src      SpO2 95 %     Weight      Height      Head Circumference      Peak Flow      Pain Score      Pain Loc      Pain Education      Exclude from Growth Chart     Most recent vital signs: Vitals:   08/03/23 0730 08/03/23 0800  BP: 114/78 132/87  Pulse: 85 90  Resp: 16 20  Temp:    SpO2: 96% 98%    CONSTITUTIONAL: Alert, responds appropriately to questions.  Chronically ill-appearing, moaning in pain HEAD: Normocephalic, atraumatic EYES: Conjunctivae clear, pupils appear equal, sclera nonicteric ENT: normal nose; moist mucous membranes NECK: Supple, normal ROM CARD: RRR; S1 and S2 appreciated, chest wall nontender to palpation without crepitus, ecchymosis or deformity RESP: Normal chest excursion without splinting or tachypnea; breath sounds clear and equal  bilaterally; no wheezes, no rhonchi, no rales, no hypoxia or respiratory distress, speaking full sentences ABD/GI: Non-distended; soft, non-tender, no rebound, no guarding, no peritoneal signs BACK: The back appears normal EXT: Normal ROM in all joints; no deformity noted, no edema, no calf tenderness or calf swelling SKIN: Normal color for age and race; warm; no rash on exposed skin NEURO: Moves all extremities equally, normal speech PSYCH: The patient's mood and manner are appropriate.   ED Results / Procedures / Treatments   LABS: (all labs ordered are listed, but only abnormal results are displayed) Labs Reviewed  CBC WITH DIFFERENTIAL/PLATELET - Abnormal; Notable for the following components:      Result Value   WBC 14.4 (*)    Neutro Abs 11.8 (*)    All other components within normal limits  COMPREHENSIVE METABOLIC PANEL WITH GFR - Abnormal; Notable for the following components:   Glucose, Bld 261 (*)    All other components within normal limits  TROPONIN I (HIGH SENSITIVITY) - Abnormal; Notable for the following components:   Troponin I (High Sensitivity) 73 (*)    All other components within normal limits  LIPASE, BLOOD  TROPONIN I (HIGH SENSITIVITY)     EKG:  EKG Interpretation Date/Time:  Monday August 03 2023 05:16:39 EDT Ventricular Rate:  102 PR Interval:  183 QRS Duration:  94 QT Interval:  342 QTC Calculation: 446 R Axis:   68  Text Interpretation: Sinus tachycardia Probable left atrial enlargement Anteroseptal infarct, age indeterminate Confirmed by Neomi Neptune [id number]) on 08/03/2023 5:29:06 AM         RADIOLOGY: My personal review and interpretation of imaging: CTA shows no dissection.  I have personally reviewed all radiology reports.   CT Angio Chest/Abd/Pel for Dissection W and/or Wo Contrast Result Date: 08/03/2023 CLINICAL DATA:  Chest pain.  Acute aortic syndrome suspected. EXAM: CT ANGIOGRAPHY CHEST, ABDOMEN AND PELVIS TECHNIQUE: Non-contrast  CT of the chest was initially obtained. Multidetector CT imaging through the chest, abdomen and pelvis was performed using the standard protocol during bolus administration of intravenous contrast. Multiplanar reconstructed images and MIPs were obtained and reviewed to evaluate the vascular anatomy. RADIATION DOSE REDUCTION: This exam was performed according to the departmental dose-optimization program which includes automated exposure control, adjustment of the mA and/or kV according to patient size and/or use of iterative reconstruction technique. CONTRAST:  OMNIPAQUE  IOHEXOL  350 MG/ML SOLN COMPARISON:  Abdomen pelvis CT 10/16/2022 FINDINGS: CTA CHEST FINDINGS Cardiovascular: No thoracic aortic aneurysm. No  hyperdense crescent in the wall of the thoracic aorta to suggest the presence of an acute intramural hematoma. No dissection of the thoracic aorta. The heart size is normal. No substantial pericardial effusion. Aortic arch branch vessel anatomy is widely patent. Mediastinum/Nodes: No mediastinal lymphadenopathy. There is no hilar lymphadenopathy. The esophagus has normal imaging features. There is no axillary lymphadenopathy. Lungs/Pleura: Calcified granuloma noted left upper lobe. 6 mm anterior right lung nodule identified on 71/6 7 mm left lower lobe perifissural nodule identified on 104/6. 2 mm right upper lobe nodule identified on 63/6. 2 mm left lower lobe nodule identified on 75/6. These nodules are stable back to a study from 08/24/2017 consistent with benign etiology. No followup imaging is recommended. No focal airspace consolidation. No pleural effusion. Musculoskeletal: No worrisome lytic or sclerotic osseous abnormality. Review of the MIP images confirms the above findings. CTA ABDOMEN AND PELVIS FINDINGS VASCULAR Aorta: Normal caliber aorta without aneurysm, dissection, vasculitis or significant stenosis. Mild to moderate atherosclerosis. Celiac: Patent without evidence of aneurysm,  dissection, vasculitis or significant stenosis. SMA: Patent without evidence of aneurysm, dissection, vasculitis or significant stenosis. Renals: Both renal arteries are patent without evidence of aneurysm, dissection, vasculitis, fibromuscular dysplasia or significant stenosis. IMA: Patent without evidence of aneurysm, dissection, vasculitis or significant stenosis. Inflow: Patent without evidence of aneurysm, dissection, vasculitis or significant stenosis. Veins: No obvious venous abnormality within the limitations of this arterial phase study. Review of the MIP images confirms the above findings. NON-VASCULAR Hepatobiliary: No suspicious focal abnormality within the liver parenchyma. There is no evidence for gallstones, gallbladder wall thickening, or pericholecystic fluid. No intrahepatic or extrahepatic biliary dilation. Pancreas: No focal mass lesion. No dilatation of the main duct. No intraparenchymal cyst. No peripancreatic edema. Spleen: No splenomegaly. No suspicious focal mass lesion. Adrenals/Urinary Tract: Nodular thickening of both adrenal glands is stable since 10/16/2022. Kidneys unremarkable. No evidence for hydroureter. The urinary bladder appears normal for the degree of distention. Stomach/Bowel: Stomach is unremarkable. No gastric wall thickening. No evidence of outlet obstruction. Duodenum is normally positioned as is the ligament of Treitz. No small bowel wall thickening. No small bowel dilatation. The terminal ileum is normal. The appendix is normal. No gross colonic mass. No colonic wall thickening. Diverticular changes are noted in the left colon without evidence of diverticulitis. Lymphatic: There is no gastrohepatic or hepatoduodenal ligament lymphadenopathy. No retroperitoneal or mesenteric lymphadenopathy. No pelvic sidewall lymphadenopathy. Reproductive: The prostate gland and seminal vesicles are unremarkable. Large right hydrocele evident. Other: No intraperitoneal free fluid.  Musculoskeletal: No worrisome lytic or sclerotic osseous abnormality. Review of the MIP images confirms the above findings. IMPRESSION: 1. No evidence for thoracic or abdominal aortic aneurysm or dissection. 2. No acute findings in the chest, abdomen, or pelvis. 3. Large right hydrocele. 4. Left colonic diverticulosis without diverticulitis. 5.  Aortic Atherosclerosis (ICD10-I70.0). Electronically Signed   By: Camellia Candle M.D.   On: 08/03/2023 06:15     PROCEDURES:  Critical Care performed: Yes, see critical care procedure note(s)   CRITICAL CARE Performed by: Josette Keylen Uzelac   Total critical care time: 30 minutes  Critical care time was exclusive of separately billable procedures and treating other patients.  Critical care was necessary to treat or prevent imminent or life-threatening deterioration.  Critical care was time spent personally by me on the following activities: development of treatment plan with patient and/or surrogate as well as nursing, discussions with consultants, evaluation of patient's response to treatment, examination of patient, obtaining history from patient or  surrogate, ordering and performing treatments and interventions, ordering and review of laboratory studies, ordering and review of radiographic studies, pulse oximetry and re-evaluation of patient's condition.   SABRA1-3 Lead EKG Interpretation  Performed by: Aadil Sur, Josette SAILOR, DO Authorized by: Mickael Mcnutt, Josette SAILOR, DO     Interpretation: normal     ECG rate:  98   ECG rate assessment: normal     Rhythm: sinus rhythm     Ectopy: none     Conduction: normal       IMPRESSION / MDM / ASSESSMENT AND PLAN / ED COURSE  I reviewed the triage vital signs and the nursing notes.    Patient here for severe sudden onset chest pain.  He is hypertensive and moaning in pain.  The patient is on the cardiac monitor to evaluate for evidence of arrhythmia and/or significant heart rate changes.   DIFFERENTIAL DIAGNOSIS  (includes but not limited to):   ACS, PE, dissection, ruptured aneurysm, chest wall pain, pneumothorax, pneumonia, CHF, hypertensive urgency, hypertensive emergency   Patient's presentation is most consistent with acute presentation with potential threat to life or bodily function.   PLAN: Will obtain cardiac labs, emergent CTA of the chest, abdomen and pelvis.  Will give Dilaudid .  EKG nonischemic.   MEDICATIONS GIVEN IN ED: Medications  nitroGLYCERIN  50 mg in dextrose 5 % 250 mL (0.2 mg/mL) infusion (has no administration in time range)  HYDROmorphone  (DILAUDID ) injection 1 mg (1 mg Intravenous Given 08/03/23 0520)  iohexol  (OMNIPAQUE ) 350 MG/ML injection 100 mL (100 mLs Intravenous Contrast Given 08/03/23 0525)  ketorolac  (TORADOL ) 30 MG/ML injection 30 mg (30 mg Intravenous Given 08/03/23 0634)  HYDROmorphone  (DILAUDID ) injection 1 mg (1 mg Intravenous Given 08/03/23 0634)  morphine  (PF) 4 MG/ML injection 4 mg (4 mg Intravenous Given 08/03/23 0757)     ED COURSE: First troponin 15.  Second pending.  CTA reviewed and interpreted by myself and the radiologist and shows no dissection, aneurysm, pneumonia, CHF, pneumothorax, PE.  Leukocytosis of 14,000 which may be reactive.  He has no infectious symptoms and is afebrile.  Normal LFTs, lipase.   7:50 AM Pt's repeat troponin elevated to 73.  Will start heparin .  He has already had full dose aspirin .  Still having discomfort after Dilaudid  x 2.  Will give morphine  and start nitroglycerin  infusion.  Repeat EKG still nonischemic.  Will discuss with hospitalist for admission with cardiology consultation.  Blood pressure currently 114/91.  CONSULTS:  Consulted and discussed patient's case with hospitalist, Dr. Tobie.  I have recommended admission and consulting physician agrees and will place admission orders.  Patient (and family if present) agree with this plan.   I reviewed all nursing notes, vitals, pertinent previous records.  All labs, EKGs,  imaging ordered have been independently reviewed and interpreted by myself.    OUTSIDE RECORDS REVIEWED: Reviewed last family medicine note on 07/13/2023.       FINAL CLINICAL IMPRESSION(S) / ED DIAGNOSES   Final diagnoses:  NSTEMI (non-ST elevated myocardial infarction) (HCC)     Rx / DC Orders   ED Discharge Orders     None        Note:  This document was prepared using Dragon voice recognition software and may include unintentional dictation errors.   Elysabeth Aust, Josette SAILOR, DO 08/03/23 [phone]

## 2023-08-03 NOTE — Consult Note (Signed)
 PHARMACY - ANTICOAGULATION CONSULT NOTE  Pharmacy Consult for heparin  infusion Indication: chest pain/ACS  Allergies  Allergen Reactions   Penicillins Nausea Only    Has patient had a PCN reaction causing immediate rash, facial/tongue/throat swelling, SOB or lightheadedness with hypotension: No Has patient had a PCN reaction causing severe rash involving mucus membranes or skin necrosis: No Has patient had a PCN reaction that required hospitalization: No Has patient had a PCN reaction occurring within the last 10 years: Yes If all of the above answers are NO, then may proceed with Cephalosporin use.     Patient Measurements: Height: 5' 10 (177.8 cm) Weight: 116 kg (255 lb 11.7 oz) IBW/kg (Calculated) : 73 HEPARIN  DW (KG): 98.7  Vital Signs: Temp: 98.6 F (37 C) (07/21 0523) Temp Source: Oral (07/21 0523) BP: 132/87 (07/21 0800) Pulse Rate: 90 (07/21 0800)  Labs: Recent Labs    08/03/23 0516 08/03/23 0701  HGB 14.5  --   HCT 45.4  --   PLT 253  --   CREATININE 1.03  --   TROPONINIHS 15 73*    Estimated Creatinine Clearance: 104.6 mL/min (by C-G formula based on SCr of 1.03 mg/dL).   Medical History: Past Medical History:  Diagnosis Date   Diabetes mellitus without complication (HCC)    type 2   Diverticulitis    Hyperlipidemia    Hypertension     Medications:  No home anticoagulants per pharmacist review  Assessment: 53 yo male presented to ED with left sided chest pain.  PMH includes HTN, DM, and HLD.  Troponin found to be elevated.  Pharmacy consulted to initiate heparin  infusion.  Baseline aPTT and Pt/INR ordered.    Goal of Therapy:  Heparin  level 0.3-0.7 units/ml Monitor platelets by anticoagulation protocol: Yes   Plan:  Give 4000 units bolus x 1 Start heparin  infusion at 1200 units/hr Check anti-Xa level in 6 hours and daily while on heparin  Continue to monitor H&H and platelets  Kayla JULIANNA Blew, PharmD 08/03/2023,8:28 AM

## 2023-08-03 NOTE — ED Notes (Signed)
 Patient is back in room. Given a urinal to void. Visitor is at bedside.

## 2023-08-03 NOTE — Interval H&P Note (Signed)
 History and Physical Interval Note:  08/03/2023 10:00 AM  Jeremiah Neal  has presented today for surgery, with the diagnosis of NSTEMI.  The various methods of treatment have been discussed with the patient and family. After consideration of risks, benefits and other options for treatment, the patient has consented to  Procedure(s): LEFT HEART CATH AND CORONARY ANGIOGRAPHY (N/A) as a surgical intervention.  The patient's history has been reviewed, patient examined, no change in status, stable for surgery.  I have reviewed the patient's chart and labs.  Questions were answered to the patient's satisfaction.    Cath Lab Visit (complete for each Cath Lab visit)  Clinical Evaluation Leading to the Procedure:   ACS: Yes.    Non-ACS:  N/A  Dimples Probus

## 2023-08-03 NOTE — Progress Notes (Signed)
 Dr. Mady came by to see pt. And his S.O. Orders received for ECHO. ECHO tech. J. Hege here now & setting up pt.

## 2023-08-04 ENCOUNTER — Other Ambulatory Visit: Payer: Self-pay

## 2023-08-04 ENCOUNTER — Encounter: Payer: Self-pay | Admitting: Internal Medicine

## 2023-08-04 DIAGNOSIS — E118 Type 2 diabetes mellitus with unspecified complications: Secondary | ICD-10-CM | POA: Diagnosis not present

## 2023-08-04 DIAGNOSIS — I10 Essential (primary) hypertension: Secondary | ICD-10-CM | POA: Diagnosis not present

## 2023-08-04 DIAGNOSIS — I5021 Acute systolic (congestive) heart failure: Secondary | ICD-10-CM

## 2023-08-04 DIAGNOSIS — I214 Non-ST elevation (NSTEMI) myocardial infarction: Secondary | ICD-10-CM | POA: Diagnosis not present

## 2023-08-04 DIAGNOSIS — F1910 Other psychoactive substance abuse, uncomplicated: Secondary | ICD-10-CM | POA: Diagnosis not present

## 2023-08-04 DIAGNOSIS — E785 Hyperlipidemia, unspecified: Secondary | ICD-10-CM | POA: Diagnosis not present

## 2023-08-04 LAB — CBC
HCT: 41.9 % (ref 39.0–52.0)
Hemoglobin: 13.7 g/dL (ref 13.0–17.0)
MCH: 27.3 pg (ref 26.0–34.0)
MCHC: 32.7 g/dL (ref 30.0–36.0)
MCV: 83.6 fL (ref 80.0–100.0)
Platelets: 228 K/uL (ref 150–400)
RBC: 5.01 MIL/uL (ref 4.22–5.81)
RDW: 14.7 % (ref 11.5–15.5)
WBC: 8 K/uL (ref 4.0–10.5)
nRBC: 0 % (ref 0.0–0.2)

## 2023-08-04 LAB — BASIC METABOLIC PANEL WITH GFR
Anion gap: 9 (ref 5–15)
BUN: 16 mg/dL (ref 6–20)
CO2: 24 mmol/L (ref 22–32)
Calcium: 8.7 mg/dL — ABNORMAL LOW (ref 8.9–10.3)
Chloride: 101 mmol/L (ref 98–111)
Creatinine, Ser: 0.95 mg/dL (ref 0.61–1.24)
GFR, Estimated: >60 mL/min (ref >60)
Glucose, Bld: 256 mg/dL — ABNORMAL HIGH (ref 70–99)
Potassium: 3.8 mmol/L (ref 3.5–5.1)
Sodium: 134 mmol/L — ABNORMAL LOW (ref 135–145)

## 2023-08-04 LAB — GLUCOSE, CAPILLARY
Glucose-Capillary: 189 mg/dL — ABNORMAL HIGH (ref 70–99)
Glucose-Capillary: 235 mg/dL — ABNORMAL HIGH (ref 70–99)

## 2023-08-04 MED ORDER — PRASUGREL HCL 10 MG PO TABS
10.0000 mg | ORAL_TABLET | Freq: Every day | ORAL | 0 refills | Status: DC
  Filled 2023-08-04: qty 30, 30d supply, fill #0

## 2023-08-04 MED ORDER — EZETIMIBE 10 MG PO TABS
10.0000 mg | ORAL_TABLET | Freq: Every day | ORAL | 11 refills | Status: DC
  Filled 2023-08-04: qty 30, 30d supply, fill #0

## 2023-08-04 MED ORDER — CARVEDILOL 3.125 MG PO TABS
3.1250 mg | ORAL_TABLET | Freq: Two times a day (BID) | ORAL | Status: DC
  Administered 2023-08-04: 3.125 mg via ORAL
  Filled 2023-08-04: qty 1

## 2023-08-04 MED ORDER — EZETIMIBE 10 MG PO TABS
10.0000 mg | ORAL_TABLET | Freq: Every day | ORAL | 3 refills | Status: DC
  Filled 2023-08-04: qty 90, 90d supply, fill #0

## 2023-08-04 MED ORDER — FUROSEMIDE 10 MG/ML IJ SOLN
20.0000 mg | Freq: Once | INTRAMUSCULAR | Status: AC
  Administered 2023-08-04: 20 mg via INTRAMUSCULAR

## 2023-08-04 MED ORDER — FUROSEMIDE 20 MG PO TABS
20.0000 mg | ORAL_TABLET | Freq: Every day | ORAL | 0 refills | Status: DC
  Filled 2023-08-04: qty 30, 30d supply, fill #0

## 2023-08-04 MED ORDER — FUROSEMIDE 10 MG/ML IJ SOLN
20.0000 mg | Freq: Once | INTRAMUSCULAR | Status: DC
  Filled 2023-08-04: qty 2

## 2023-08-04 MED ORDER — ASPIRIN 81 MG PO TBEC
81.0000 mg | DELAYED_RELEASE_TABLET | Freq: Every day | ORAL | 12 refills | Status: DC | PRN
  Filled 2023-08-04: qty 30, 30d supply, fill #0

## 2023-08-04 MED ORDER — CARVEDILOL 3.125 MG PO TABS
3.1250 mg | ORAL_TABLET | Freq: Two times a day (BID) | ORAL | 0 refills | Status: DC
  Filled 2023-08-04: qty 60, 30d supply, fill #0

## 2023-08-04 MED ORDER — EZETIMIBE 10 MG PO TABS
10.0000 mg | ORAL_TABLET | Freq: Every day | ORAL | Status: DC
  Administered 2023-08-04: 10 mg via ORAL
  Filled 2023-08-04: qty 1

## 2023-08-04 MED ORDER — NITROGLYCERIN 0.4 MG SL SUBL
0.4000 mg | SUBLINGUAL_TABLET | SUBLINGUAL | 0 refills | Status: DC | PRN
  Filled 2023-08-04: qty 25, 8d supply, fill #0

## 2023-08-04 MED ORDER — ASPIRIN 81 MG PO TBEC
81.0000 mg | DELAYED_RELEASE_TABLET | Freq: Every day | ORAL | 12 refills | Status: AC
  Filled 2023-08-04: qty 30, 30d supply, fill #0

## 2023-08-04 NOTE — Progress Notes (Signed)
 Heart Failure Navigator Progress Note  Assessed for Heart & Vascular TOC clinic readiness.  Patient receiving discharge paperwork from his RN at this time.  Patient and significant other anxious to be discharged.  New TOC scheduled at the Heart Failure Clinic on 7/24 25 @ 1:30 PM.  Patient agreed to Heart Failure education at follow-up appointment since he was in such a hurry to be discharged.  Living Better with Heart Failure  education booklet provided to patient prior to his discharge.   Navigator will sign off at this time.  Charmaine Pines, RN, BSN [facility] Heart Failure Navigator Secure Chat Only

## 2023-08-04 NOTE — Discharge Summary (Addendum)
 Physician Discharge Summary   Patient: Jeremiah Neal MRN: [id number] DOB: December 01, 1970  Admit date:     08/03/2023  Discharge date: 08/04/23  Discharge Physician: Leita Blanch   PCP: Kayla Jeoffrey RAMAN, FNP   Recommendations at discharge:   follow-up [facility] cardiology Dr. Mady in one week follow-up PCP in 1 to 2 weeks. Keep log of his sugars at home review with PCP  Discharge Diagnoses: Principal Problem:   NSTEMI (non-ST elevated myocardial infarction) (HCC) Active Problems:   Hypertension   Hyperlipidemia   DM (diabetes mellitus), type 2 with complications (HCC)   Acute systolic CHF (congestive heart failure) (HCC)   Jeremiah Neal is a 53 y.o. male with medical history significant of hypertension, diabetes, hyperlipidemia comes to the emergency room accompanied by his wife with complaints of chest pain left-sided radiating to the jaw and becoming diaphoretic on and off since 3 o'clock this morning which woke him up.   NSTEMI CHF mild systolic with EF 54% -- risk factors obesity, uncontrolled diabetes and hypertension along with hyperlipidemia -- cardiology consultation with [facility] cardiology Dr. Mady -- patient initially was started on IV heparin  drip and nitroglycerin  drip. He underwent emergent cardiac cath and had stent placement in the mid left circumflex. -- Now on aspirin  and Effient  --cont GDMT -- continue statins, zetia  -- received IV Lasix  times 1. will start patient on PO Lasix  20 mg daily per cardiology recommendation -- okay to go home today per cardiology. Patient is improving. He is agreeable.   Hypertension -- follow cardiology recommendation as above   Hyperlipidemia -- statins and Zetia    Type II diabetes, hyperglycemia, uncontrolled, intermittent noncompliance to meds -- hemoglobin A1c 9.9 -- resume long-acting insulin  and sliding scale -- patient also takes metformin  at home will hold off since he had cath for 48 hours   Polysubstance use -- smokes tobacco and  marijuana -- recommend cessation  Patient will discharge to home. No family at bedside this morning. I will follow-up with cardiology as outpatient. Dietitian will see patient prior to discharge.      Consultants: [facility] cardiology Procedures performed: cardiac cath Disposition: Home Diet recommendation:  Discharge Diet Orders (From admission, onward)     Start     Ordered   08/04/23 0000  Diet - low sodium heart healthy        08/04/23 1107   08/04/23 0000  Diet Carb Modified        08/04/23 1107           Cardiac and Carb modified diet DISCHARGE MEDICATION: Allergies as of 08/04/2023       Reactions   Penicillins Nausea Only   Has patient had a PCN reaction causing immediate rash, facial/tongue/throat swelling, SOB or lightheadedness with hypotension: No Has patient had a PCN reaction causing severe rash involving mucus membranes or skin necrosis: No Has patient had a PCN reaction that required hospitalization: No Has patient had a PCN reaction occurring within the last 10 years: Yes If all of the above answers are NO, then may proceed with Cephalosporin use.        Medication List     STOP taking these medications    amLODipine  10 MG tablet Commonly known as: NORVASC    BASAGLAR  KWIKPEN [place]       TAKE these medications    acetaminophen  500 MG tablet Commonly known as: TYLENOL  Take 500 mg by mouth every 6 (six) hours as needed for mild pain.  aspirin  EC 81 MG tablet Take 1 tablet (81 mg total) by mouth daily. Swallow whole. Start taking on: August 05, 2023 What changed:  when to take this reasons to take this   atorvastatin  80 MG tablet Commonly known as: LIPITOR  Take 1 tablet (80 mg total) by mouth daily.   BD Pen Needle Short Ultrafine 31G X 8 MM Misc Generic drug: Insulin  Pen Needle USE AS DIRECTED   carvedilol  3.125 MG tablet Commonly known as: COREG  Take 1 tablet (3.125 mg total) by mouth 2 (two) times daily with a meal.   ezetimibe  10  MG tablet Commonly known as: Zetia  Take 1 tablet (10 mg total) by mouth daily. What changed: You were already taking a medication with the same name, and this prescription was added. Make sure you understand how and when to take each.   ezetimibe  10 MG tablet Commonly known as: Zetia  Take 1 tablet (10 mg total) by mouth daily. What changed: Another medication with the same name was added. Make sure you understand how and when to take each.   furosemide  20 MG tablet Commonly known as: Lasix  Take 1 tablet (20 mg total) by mouth daily. Start taking on: August 05, 2023   gabapentin  100 MG capsule Commonly known as: NEURONTIN  Take 1-2 capsules (100-200 mg total) by mouth 3 (three) times daily.   Levemir  FlexTouch 100 UNIT/ML FlexTouch Pen Generic drug: insulin  detemir Inject 65 Units into the skin daily.   losartan  100 MG tablet Commonly known as: COZAAR  Take 1 tablet (100 mg total) by mouth daily.   metFORMIN  1000 MG tablet Commonly known as: GLUCOPHAGE  Take 1 tablet (1,000 mg total) by mouth 2 (two) times daily with a meal. Notes to patient: Start taking from 08/06/23   nitroGLYCERIN  0.4 MG SL tablet Commonly known as: NITROSTAT  Place 1 tablet (0.4 mg total) under the tongue every 5 (five) minutes x 3 doses as needed for chest pain.   Ozempic  (0.25 or 0.5 MG/DOSE) 2 MG/1.5ML Sopn Generic drug: Semaglutide (0.25 or 0.5MG /DOS) Inject 0.25 mg into the skin once a week. Start with 0.25MG  once a week x 4 weeks, then increase to 0.5MG  weekly.   prasugrel  10 MG Tabs tablet Commonly known as: EFFIENT  Take 1 tablet (10 mg total) by mouth daily.        Follow-up Information     Kayla Jeoffrey RAMAN, FNP. Schedule an appointment as soon as possible for a visit in 1 week(s).   Specialty: Family Medicine Contact information: [address] [place] [address] [phone]         Mady Bruckner, MD. Schedule an appointment as soon as possible for a visit in 1 week(s).    Specialty: Cardiology Why: NSTEMI f/u Contact information: [address] [place] [address] [phone]         [facility]. Go on 08/04/2023.   Specialty: Cardiology Contact information: [address] [place]  [address] [phone]               Discharge Exam: Fredricka Weights   08/03/23 0827 08/03/23 0945  Weight: 116 kg 116 kg   onstitutional:      Appearance: He is well-developed.  Eyes:     Pupils: Pupils are equal, round, and reactive to light.  Cardiovascular:     Rate and Rhythm: Normal rate and regular rhythm.     Heart sounds: Normal heart sounds.  Pulmonary:  Breath sounds: Normal breath sounds.  Abdominal:     Palpations: Abdomen is soft. Abdominal obesity Skin:    General: Skin is warm and dry.  Neurological:     Mental Status: He is alert.   Condition at discharge: fair  The results of significant diagnostics from this hospitalization (including imaging, microbiology, ancillary and laboratory) are listed below for reference.   Imaging Studies: ECHOCARDIOGRAM COMPLETE Result Date: 08/03/2023    ECHOCARDIOGRAM REPORT   Patient Name:   NICKLAS MCSWEENEY Date of Exam: 08/03/2023 Medical Rec #:  [id number]    Height:       70.0 in Accession #:    [id number]   Weight:       255.7 lb Date of Birth:  1970/02/24   BSA:          2.317 m Patient Age:    52 years     BP:           109/77 mmHg Patient Gender: M            HR:           77 bpm. Exam Location:  [facility] Procedure: 2D Echo, Cardiac Doppler, Color Doppler, Strain Analysis and 3D Echo            (Both Spectral and Color Flow Doppler were utilized during            procedure). Indications:     Chest pain R07.9  History:         Patient has no prior history of Echocardiogram examinations.                  Risk Factors:Diabetes, Dyslipidemia and Hypertension.  Sonographer:     Christopher Furnace Referring Phys:  [id number] CHRISTOPHER  END Diagnosing Phys: Lonni Hanson MD  Sonographer Comments: Global longitudinal strain was attempted. IMPRESSIONS  1. Left ventricular ejection fraction, by estimation, is 45 to 50%. Left ventricular ejection fraction by 3D volume is 42 %. The left ventricle has mildly decreased function. The left ventricle demonstrates regional wall motion abnormalities (see scoring diagram/findings for description). There is severe left ventricular hypertrophy. Left ventricular diastolic parameters are indeterminate. There is moderate hypokinesis of the left ventricular, basal-mid inferolateral wall.  2. Right ventricular systolic function is normal. The right ventricular size is normal.  3. The mitral valve is normal in structure. Mild mitral valve regurgitation. No evidence of mitral stenosis.  4. The aortic valve was not well visualized. Aortic valve regurgitation is not visualized. No aortic stenosis is present.  5. The inferior vena cava is normal in size with <50% respiratory variability, suggesting right atrial pressure of 8 mmHg. FINDINGS  Left Ventricle: Left ventricular ejection fraction, by estimation, is 45 to 50%. Left ventricular ejection fraction by 3D volume is 42 %. The left ventricle has mildly decreased function. The left ventricle demonstrates regional wall motion abnormalities. Moderate hypokinesis of the left ventricular, basal-mid inferolateral wall. Global longitudinal strain performed but not reported based on interpreter judgement due to suboptimal tracking. The left ventricular internal cavity size was normal in size. There is severe left ventricular hypertrophy. Left ventricular diastolic parameters are indeterminate. Right Ventricle: The right ventricular size is normal. No increase in right ventricular wall thickness. Right ventricular systolic function is normal. Left Atrium: Left atrial size was normal in size. Right Atrium: Right atrial size was not well visualized. Pericardium: There is no  evidence of pericardial effusion. Mitral Valve: The mitral valve is normal in structure.  Mild mitral annular calcification. Mild mitral valve regurgitation. No evidence of mitral valve stenosis. MV peak gradient, 5.2 mmHg. The mean mitral valve gradient is 3.0 mmHg. Tricuspid Valve: The tricuspid valve is not well visualized. Tricuspid valve regurgitation is trivial. Aortic Valve: The aortic valve was not well visualized. Aortic valve regurgitation is not visualized. No aortic stenosis is present. Aortic valve mean gradient measures 2.0 mmHg. Aortic valve peak gradient measures 2.9 mmHg. Aortic valve area, by VTI measures 3.81 cm. Pulmonic Valve: The pulmonic valve was not well visualized. Pulmonic valve regurgitation is not visualized. No evidence of pulmonic stenosis. Aorta: The aortic root is normal in size and structure. Pulmonary Artery: The pulmonary artery is not well seen. Venous: The inferior vena cava is normal in size with less than 50% respiratory variability, suggesting right atrial pressure of 8 mmHg. IAS/Shunts: The interatrial septum was not well visualized. Additional Comments: 3D was performed not requiring image post processing on an independent workstation and was abnormal.  LEFT VENTRICLE PLAX 2D LVIDd:         4.00 cm         Diastology LVIDs:         2.80 cm         LV e' medial:    8.38 cm/s LV PW:         1.60 cm         LV E/e' medial:  13.1 LV IVS:        1.80 cm         LV e' lateral:   9.90 cm/s LVOT diam:     2.20 cm         LV E/e' lateral: 11.1 LV SV:         55 LV SV Index:   24 LVOT Area:     3.80 cm        3D Volume EF                                LV 3D EF:    Left                                             ventricul                                             ar                                             ejection                                             fraction                                             by 3D  volume is                                              42 %.                                 3D Volume EF:                                3D EF:        42 % RIGHT VENTRICLE RV S prime:     13.40 cm/s TAPSE (M-mode): 2.7 cm LEFT ATRIUM           Index LA diam:      3.10 cm 1.34 cm/m LA Vol (A4C): 22.5 ml 9.71 ml/m  AORTIC VALVE AV Area (Vmax):    3.72 cm AV Area (Vmean):   3.62 cm AV Area (VTI):     3.81 cm AV Vmax:           85.65 cm/s AV Vmean:          58.750 cm/s AV VTI:            0.146 m AV Peak Grad:      2.9 mmHg AV Mean Grad:      2.0 mmHg LVOT Vmax:         83.80 cm/s LVOT Vmean:        55.900 cm/s LVOT VTI:          0.146 m LVOT/AV VTI ratio: 1.00  AORTA Ao Root diam: 2.70 cm MITRAL VALVE MV Area (PHT): 4.57 cm     SHUNTS MV Area VTI:   1.87 cm     Systemic VTI:  0.15 m MV Peak grad:  5.2 mmHg     Systemic Diam: 2.20 cm MV Mean grad:  3.0 mmHg MV Vmax:       1.14 m/s MV Vmean:      76.8 cm/s MV Decel Time: 166 msec MV E velocity: 110.00 cm/s MV A velocity: 74.90 cm/s MV E/A ratio:  1.47 Lonni Hanson MD Electronically signed by Lonni Hanson MD Signature Date/Time: 08/03/2023/3:40:57 PM    Final    CARDIAC CATHETERIZATION Result Date: 08/03/2023   Prox Cx lesion is 20% stenosed.   Post intervention, there is a 0% residual stenosis. Conclusions: Severe single-vessel coronary artery disease with 99% stenosis of the mid LCx with TIMI I flow.  There is also mild-moderate, nonobstructive disease involving the mid LAD (up to 50%) and mid LCx (10%). Mildly reduced left ventricular systolic function with global hypokinesis (LVEF 45-50%) and mildly elevated filling pressure (LVEDP 20 mmHg). Successful PCI to proximal/mid LCx using overlapping Onyx Frontier 3.0 x 8 mm (proximal) and 2.75 x 15 mm (distal) drug-eluting stents with 0% residual stenosis and TIMI-3 flow.  Proximal stent was placed due to suspected edge dissection following deployment of initial (2.75 x 15 mm) stent. Recommendations: Overnight observation. Dual  antiplatelet therapy with aspirin  and prasugrel  for at least 12 months. Aggressive secondary prevention of coronary artery disease, including continuation of high intensity statin therapy.  Tobacco and drug cessation also encouraged. Follow-up echocardiogram. Lonni Hanson, MD [facility]  CT Angio Chest/Abd/Pel for Dissection W and/or Wo Contrast Result Date: 08/03/2023 CLINICAL DATA:  Chest pain.  Acute aortic syndrome suspected. EXAM: CT ANGIOGRAPHY CHEST, ABDOMEN AND PELVIS TECHNIQUE: Non-contrast CT of the chest was initially obtained. Multidetector CT imaging through the chest, abdomen and pelvis was performed using the standard protocol during bolus administration of intravenous contrast. Multiplanar reconstructed images and MIPs were obtained and reviewed to evaluate the vascular anatomy. RADIATION DOSE REDUCTION: This exam was performed according to the departmental dose-optimization program which includes automated exposure control, adjustment of the mA and/or kV according to patient size and/or use of iterative reconstruction technique. CONTRAST:  OMNIPAQUE  IOHEXOL  350 MG/ML SOLN COMPARISON:  Abdomen pelvis CT 10/16/2022 FINDINGS: CTA CHEST FINDINGS Cardiovascular: No thoracic aortic aneurysm. No hyperdense crescent in the wall of the thoracic aorta to suggest the presence of an acute intramural hematoma. No dissection of the thoracic aorta. The heart size is normal. No substantial pericardial effusion. Aortic arch branch vessel anatomy is widely patent. Mediastinum/Nodes: No mediastinal lymphadenopathy. There is no hilar lymphadenopathy. The esophagus has normal imaging features. There is no axillary lymphadenopathy. Lungs/Pleura: Calcified granuloma noted left upper lobe. 6 mm anterior right lung nodule identified on 71/6 7 mm left lower lobe perifissural nodule identified on 104/6. 2 mm right upper lobe nodule identified on 63/6. 2 mm left lower lobe nodule identified on 75/6. These  nodules are stable back to a study from 08/24/2017 consistent with benign etiology. No followup imaging is recommended. No focal airspace consolidation. No pleural effusion. Musculoskeletal: No worrisome lytic or sclerotic osseous abnormality. Review of the MIP images confirms the above findings. CTA ABDOMEN AND PELVIS FINDINGS VASCULAR Aorta: Normal caliber aorta without aneurysm, dissection, vasculitis or significant stenosis. Mild to moderate atherosclerosis. Celiac: Patent without evidence of aneurysm, dissection, vasculitis or significant stenosis. SMA: Patent without evidence of aneurysm, dissection, vasculitis or significant stenosis. Renals: Both renal arteries are patent without evidence of aneurysm, dissection, vasculitis, fibromuscular dysplasia or significant stenosis. IMA: Patent without evidence of aneurysm, dissection, vasculitis or significant stenosis. Inflow: Patent without evidence of aneurysm, dissection, vasculitis or significant stenosis. Veins: No obvious venous abnormality within the limitations of this arterial phase study. Review of the MIP images confirms the above findings. NON-VASCULAR Hepatobiliary: No suspicious focal abnormality within the liver parenchyma. There is no evidence for gallstones, gallbladder wall thickening, or pericholecystic fluid. No intrahepatic or extrahepatic biliary dilation. Pancreas: No focal mass lesion. No dilatation of the main duct. No intraparenchymal cyst. No peripancreatic edema. Spleen: No splenomegaly. No suspicious focal mass lesion. Adrenals/Urinary Tract: Nodular thickening of both adrenal glands is stable since 10/16/2022. Kidneys unremarkable. No evidence for hydroureter. The urinary bladder appears normal for the degree of distention. Stomach/Bowel: Stomach is unremarkable. No gastric wall thickening. No evidence of outlet obstruction. Duodenum is normally positioned as is the ligament of Treitz. No small bowel wall thickening. No small bowel  dilatation. The terminal ileum is normal. The appendix is normal. No gross colonic mass. No colonic wall thickening. Diverticular changes are noted in the left colon without evidence of diverticulitis. Lymphatic: There is no gastrohepatic or hepatoduodenal ligament lymphadenopathy. No retroperitoneal or mesenteric lymphadenopathy. No pelvic sidewall lymphadenopathy. Reproductive: The prostate gland and seminal vesicles are unremarkable. Large right hydrocele evident. Other: No intraperitoneal free fluid. Musculoskeletal: No worrisome lytic or sclerotic osseous abnormality. Review of the MIP images confirms the above findings. IMPRESSION: 1. No evidence for thoracic or abdominal aortic aneurysm or dissection. 2. No acute findings in the chest, abdomen, or pelvis. 3. Large right hydrocele. 4. Left colonic diverticulosis without diverticulitis. 5.  Aortic  Atherosclerosis (ICD10-I70.0). Electronically Signed   By: Camellia Candle M.D.   On: 08/03/2023 06:15    Microbiology: No results found for this or any previous visit.  Labs: CBC: Recent Labs  Lab 08/03/23 0516 08/04/23 0603  WBC 14.4* 8.0  NEUTROABS 11.8*  --   HGB 14.5 13.7  HCT 45.4 41.9  MCV 84.2 83.6  PLT 253 228   Basic Metabolic Panel: Recent Labs  Lab 08/03/23 0516 08/04/23 0603  NA 135 134*  K 4.2 3.8  CL 100 101  CO2 25 24  GLUCOSE 261* 256*  BUN 20 16  CREATININE 1.03 0.95  CALCIUM  8.9 8.7*   Liver Function Tests: Recent Labs  Lab 08/03/23 0516  AST 21  ALT 27  ALKPHOS 81  BILITOT 0.5  PROT 6.5  ALBUMIN 3.7   CBG: Recent Labs  Lab 08/03/23 1301 08/03/23 1609 08/03/23 2113 08/04/23 0912 08/04/23 1119  GLUCAP 201* 236* 264* 189* 235*    Discharge time spent: greater than 30 minutes.  Signed: Leita Blanch, MD [facility] 08/04/2023

## 2023-08-04 NOTE — Progress Notes (Signed)
 Heart Failure Stewardship Pharmacy Note  PCP: Kayla Jeoffrey RAMAN, FNP PCP-Cardiologist: None  Discharge orders have already been placed. Patient education provided. Please reach out with any concerns.  Please do not hesitate to reach out with questions or concerns,  Jaun Bash, PharmD, CPP, BCPS, [facility] Heart Failure Pharmacist  Phone - [phone] 08/04/2023 11:59 AM

## 2023-08-04 NOTE — Inpatient Diabetes Management (Signed)
 Inpatient Diabetes Program Recommendations  AACE/ADA: New Consensus Statement on Inpatient Glycemic Control (2015)  Target Ranges:  Prepandial:   less than 140 mg/dL      Peak postprandial:   less than 180 mg/dL (1-2 hours)      Critically ill patients:  140 - 180 mg/dL   Lab Results  Component Value Date   GLUCAP 189 (H) 08/04/2023   HGBA1C 9.9 (H) 07/13/2023    Review of Glycemic Control  Latest Reference Range & Units 08/03/23 13:01 08/03/23 16:09 08/03/23 21:13 08/04/23 09:12  Glucose-Capillary 70 - 99 mg/dL 798 (H) 763 (H) 735 (H) 189 (H)   Diabetes history: DM  Outpatient Diabetes medications:  Metformin  1000 mg bid Ozempic  0.25 mg weekly Basaglar  65 units daily Current orders for Inpatient glycemic control:  Novolog  0-9 units tid with meals and HS Semglee  65 units daily  Inpatient Diabetes Program Recommendations:   Agree with current orders.  A1C=9.9%.  Note patient newly started on Ozempic .    Thanks,  Randall Bullocks, RN, BC-ADM Inpatient Diabetes Coordinator Pager [phone]  (8a-5p)

## 2023-08-04 NOTE — Discharge Instructions (Signed)
 Patient advised smoking cessation keep log of your blood sugar follow-up [facility] cardiology Dr.end at home and review with PCP

## 2023-08-04 NOTE — Final Progress Note (Signed)
 Pt/spouse given dc/rx instructions. Advised to continue taking the Levemir  per MD. Pt/spouse spoke to CHF team and set up appt. Pt and spouse advised to return to the ED as needed/worse. Pt/spouse voices understanding. Meds were delivered to pt/spouse to room . Dietician has come to speak to pt/spouse. No distress noted with pt. Pt to be wheeled out.

## 2023-08-04 NOTE — Plan of Care (Signed)
  Problem: Education: Goal: Ability to describe self-care measures that may prevent or decrease complications (Diabetes Survival Skills Education) will improve Outcome: Adequate for Discharge Goal: Individualized Educational Video(s) Outcome: Adequate for Discharge   Problem: Coping: Goal: Ability to adjust to condition or change in health will improve Outcome: Adequate for Discharge   Problem: Fluid Volume: Goal: Ability to maintain a balanced intake and output will improve Outcome: Adequate for Discharge   Problem: Health Behavior/Discharge Planning: Goal: Ability to identify and utilize available resources and services will improve Outcome: Adequate for Discharge Goal: Ability to manage health-related needs will improve Outcome: Adequate for Discharge   Problem: Metabolic: Goal: Ability to maintain appropriate glucose levels will improve Outcome: Adequate for Discharge   Problem: Nutritional: Goal: Maintenance of adequate nutrition will improve Outcome: Adequate for Discharge Goal: Progress toward achieving an optimal weight will improve Outcome: Adequate for Discharge   Problem: Skin Integrity: Goal: Risk for impaired skin integrity will decrease Outcome: Adequate for Discharge   Problem: Tissue Perfusion: Goal: Adequacy of tissue perfusion will improve Outcome: Adequate for Discharge   Problem: Education: Goal: Understanding of cardiac disease, CV risk reduction, and recovery process will improve Outcome: Adequate for Discharge Goal: Individualized Educational Video(s) Outcome: Adequate for Discharge   Problem: Activity: Goal: Ability to tolerate increased activity will improve Outcome: Adequate for Discharge   Problem: Cardiac: Goal: Ability to achieve and maintain adequate cardiovascular perfusion will improve Outcome: Adequate for Discharge   Problem: Health Behavior/Discharge Planning: Goal: Ability to safely manage health-related needs after discharge  will improve Outcome: Adequate for Discharge   Problem: Education: Goal: Understanding of CV disease, CV risk reduction, and recovery process will improve Outcome: Adequate for Discharge Goal: Individualized Educational Video(s) Outcome: Adequate for Discharge   Problem: Activity: Goal: Ability to return to baseline activity level will improve Outcome: Adequate for Discharge   Problem: Cardiovascular: Goal: Ability to achieve and maintain adequate cardiovascular perfusion will improve Outcome: Adequate for Discharge Goal: Vascular access site(s) Level 0-1 will be maintained Outcome: Adequate for Discharge   Problem: Health Behavior/Discharge Planning: Goal: Ability to safely manage health-related needs after discharge will improve Outcome: Adequate for Discharge

## 2023-08-04 NOTE — Plan of Care (Signed)
 Nutrition Education Note  RD consulted for nutrition education regarding CAD and DM.  53 y/o male with h/o substance abuse, HTN, HLD and poorly controlled DM who is admitted with NSTEMI and CHF.  Met with patient and patient's girlfriend in room today. Pt does not seem very interested in diet education at this time as he is anxious to discharge. Pt reports that he does not follow any particular diet at home. Girlfriend at bedside, reports that she and her two son's are Type I diabetics and reports that she was told by a MD that they can just eat whatever they want and cover with insulin . RD explained to the patient and girlfriend the differences in Type I and Type II diabetes and explained that the recommendations are different for each.   RD provided Heart Healthy Consistent Carbohydrate Nutrition Therapy  handout from the Academy of Nutrition and Dietetics. Discussed the importance of limiting sodium from the diet and provided examples on ways to decrease sodium intake by limiting processed foods and restaurant foods. Pt reports that he does not eat much salt at home. Discouraged use of salt shaker. Encouraged fresh fruits and vegetables as well as whole grain sources of carbohydrates to maximize fiber intake.   RD provided basic diabetes diet education. Discussed different food groups and their effects on blood sugar, emphasizing carbohydrate-containing foods. Provided list of carbohydrates and recommended serving sizes of common foods.  Discussed importance of controlled and consistent carbohydrate intake throughout the day. Provided examples of ways to balance meals/snacks and encouraged intake of high-fiber, whole grain complex carbohydrates.   Recommend regular exercise as allowed per MD.  Teach back method was attempted.  Expect fair compliance.  Body mass index is 36.69 kg/m. Pt meets criteria for obesity based on current BMI.  Current diet order is HH/CHO modified, patient is  consuming approximately 100% of meals at this time. Labs and medications reviewed. No further nutrition interventions warranted at this time. RD contact information provided. If additional nutrition issues arise, please re-consult RD.    Augustin Shams MS, RD, LDN If unable to be reached, please send secure chat to RD inpatient available from 8:00a-4:00p daily

## 2023-08-04 NOTE — Plan of Care (Signed)
  Problem: Education: Goal: Ability to describe self-care measures that may prevent or decrease complications (Diabetes Survival Skills Education) will improve Outcome: Progressing Goal: Individualized Educational Video(s) Outcome: Progressing   Problem: Coping: Goal: Ability to adjust to condition or change in health will improve Outcome: Progressing   Problem: Fluid Volume: Goal: Ability to maintain a balanced intake and output will improve Outcome: Progressing   Problem: Health Behavior/Discharge Planning: Goal: Ability to identify and utilize available resources and services will improve Outcome: Progressing Goal: Ability to manage health-related needs will improve Outcome: Progressing   Problem: Metabolic: Goal: Ability to maintain appropriate glucose levels will improve Outcome: Progressing   Problem: Nutritional: Goal: Maintenance of adequate nutrition will improve Outcome: Progressing Goal: Progress toward achieving an optimal weight will improve Outcome: Progressing   Problem: Skin Integrity: Goal: Risk for impaired skin integrity will decrease Outcome: Progressing   Problem: Tissue Perfusion: Goal: Adequacy of tissue perfusion will improve Outcome: Progressing   Problem: Education: Goal: Understanding of cardiac disease, CV risk reduction, and recovery process will improve Outcome: Progressing Goal: Individualized Educational Video(s) Outcome: Progressing   Problem: Activity: Goal: Ability to tolerate increased activity will improve Outcome: Progressing   Problem: Cardiac: Goal: Ability to achieve and maintain adequate cardiovascular perfusion will improve Outcome: Progressing   Problem: Health Behavior/Discharge Planning: Goal: Ability to safely manage health-related needs after discharge will improve Outcome: Progressing   Problem: Education: Goal: Understanding of CV disease, CV risk reduction, and recovery process will improve Outcome:  Progressing Goal: Individualized Educational Video(s) Outcome: Progressing   Problem: Activity: Goal: Ability to return to baseline activity level will improve Outcome: Progressing   Problem: Cardiovascular: Goal: Ability to achieve and maintain adequate cardiovascular perfusion will improve Outcome: Progressing Goal: Vascular access site(s) Level 0-1 will be maintained Outcome: Progressing   Problem: Health Behavior/Discharge Planning: Goal: Ability to safely manage health-related needs after discharge will improve Outcome: Progressing   Problem: Education: Goal: Knowledge of General Education information will improve Description: Including pain rating scale, medication(s)/side effects and non-pharmacologic comfort measures Outcome: Progressing   Problem: Health Behavior/Discharge Planning: Goal: Ability to manage health-related needs will improve Outcome: Progressing   Problem: Clinical Measurements: Goal: Ability to maintain clinical measurements within normal limits will improve Outcome: Progressing Goal: Will remain free from infection Outcome: Progressing Goal: Diagnostic test results will improve Outcome: Progressing Goal: Respiratory complications will improve Outcome: Progressing Goal: Cardiovascular complication will be avoided Outcome: Progressing   Problem: Activity: Goal: Risk for activity intolerance will decrease Outcome: Progressing   Problem: Nutrition: Goal: Adequate nutrition will be maintained Outcome: Progressing   Problem: Coping: Goal: Level of anxiety will decrease Outcome: Progressing   Problem: Elimination: Goal: Will not experience complications related to bowel motility Outcome: Progressing Goal: Will not experience complications related to urinary retention Outcome: Progressing   Problem: Pain Managment: Goal: General experience of comfort will improve and/or be controlled Outcome: Progressing   Problem: Safety: Goal: Ability  to remain free from injury will improve Outcome: Progressing   Problem: Skin Integrity: Goal: Risk for impaired skin integrity will decrease Outcome: Progressing

## 2023-08-04 NOTE — Progress Notes (Addendum)
 Progress Note  Patient Name: Jeremiah Neal Date of Encounter: 08/04/2023 [facility] Cardiologist: Jeyda Siebel  Interval Summary   Jeremiah Neal feels okay this morning but endorses some pounding in the chest.  He had transient chest pain and shortness of breath overnight that resolved after receiving morphine .  Vital Signs Vitals:   08/03/23 2024 08/03/23 2326 08/04/23 0332 08/04/23 0818  BP: 132/82 (!) 148/91 131/77   Pulse: 85 92 81 86  Resp: 20 20 17    Temp: 97.7 F (36.5 C) 98.1 F (36.7 C) 97.8 F (36.6 C) 98.3 F (36.8 C)  TempSrc:    Oral  SpO2: 100% 100% 99% 97%  Weight:      Height:        Intake/Output Summary (Last 24 hours) at 08/04/2023 0902 Last data filed at 08/04/2023 0400 Gross per 24 hour  Intake 2334.35 ml  Output --  Net 2334.35 ml      08/03/2023    9:45 AM 08/03/2023    8:27 AM 07/13/2023    2:51 PM  Last 3 Weights  Weight (lbs) 255 lb 11.7 oz 255 lb 11.7 oz 251 lb 12.8 oz  Weight (kg) 116 kg 116 kg 114.216 kg      Telemetry/ECG  Normal sinus rhythm - Personally Reviewed  Physical Exam  GEN: No acute distress.   Neck: No JVD Cardiac: RRR, no murmurs, rubs, or gallops.  Right radial arteriotomy site with mild bruising but no hematoma. Respiratory: Clear to auscultation bilaterally. GI: Soft, nontender, non-distended  MS: No edema  Assessment & Plan  NSTEMI: Patient presented with sudden onset of chest pain that began at 3 AM yesterday, found to have subtotally occluded mid LCx successfully treated with 2 overlapping DES.  He is continued to have some sporadic chest pain after the procedure.  Query if some of this is related to his infarct and/or microvascular dysfunction.  He is also risk for vasospasm with history of cocaine use.  EKG this morning is stable without acute ischemic changes.  Carvedilol  3.125 mg twice daily has been started.  I will have the patient ambulate later today to see if he has any recurrent chest pain or dyspnea.  He  will need to remain on DAPT with aspirin  and prasugrel  for at least 12 months.  Cardiac rehab referral has already been placed.  Heart failure with mildly reduced ejection fraction due to ischemic cardiomyopathy: LVEF mildly reduced with basal/mid inferolateral hypokinesis noted on echo.  LVEDP was also mildly elevated at the time of catheterization yesterday.  Given report of dyspnea, I suspect that Jeremiah Neal may be experiencing mild heart failure symptoms.  I will administer furosemide  20 mg IV x 1 today and initiate carvedilol  3.125 mg twice daily, as above.  Will plan to continue losartan  100 mg daily.  Defer adding SGLT2 inhibitor and aldosterone antagonist for the time being given mildly reduced LVEF.  Based on response to furosemide , he may benefit from low-dose outpatient diuretic therapy as well.  Plan to repeat an echocardiogram in about 3 months to reassess LVEF.  Hypertension: Blood pressure labile and transiently hypotensive after catheterization yesterday.  Upper normal blood pressure today.  Continue holding amlodipine  to allow for optimization of GDMT in the setting of heart failure with mildly reduced ejection fraction.  Continue losartan  100 mg daily and add carvedilol  3.125 mg twice daily.  Hyperlipidemia associated with type 2 diabetes mellitus: Continue atorvastatin  80 mg daily and ezetimibe  10 mg daily for target LDL  less than 70.  Ongoing management of DM per primary team.  Polysubstance abuse: Patient counseled to avoid further cocaine use as well as to try quitting marijuana, tobacco, and alcohol.  Disposition: If Jeremiah Neal is able to ambulate without further shortness of breath or chest pain in 4 hours from now (after having received carvedilol  and furosemide ), I think it would be reasonable to discharge him home with close outpatient follow-up.  For questions or updates, please contact [facility] Please consult [email or web address] for contact info under [facility]  Card[facility].     Signed, Lonni Hanson, MD

## 2023-08-05 ENCOUNTER — Telehealth: Payer: Self-pay | Admitting: Family

## 2023-08-05 LAB — LIPOPROTEIN A (LPA): Lipoprotein (a): 103.5 nmol/L — ABNORMAL HIGH (ref <75.0)

## 2023-08-05 NOTE — Telephone Encounter (Signed)
 Called to confirm/remind patient of their appointment at the [facility] on 08/06/23.   Appointment:   [] Confirmed  [x] Left mess   [] No answer/No voice mail  [] VM Full/unable to leave message  [] Phone not in service  Patient reminded to bring all medications and/or complete list.  Confirmed patient has transportation. Gave directions, instructed to utilize valet parking.

## 2023-08-05 NOTE — Progress Notes (Unsigned)
 [facility] Note   Referring Physician: admission PCP: Kayla Jeoffrey RAMAN, FNP Cardiologist: None   Chief Complaint: fatigue   HPI:  Jeremiah Neal is a 53 y/o male with a history of HTN, HLD, NSTEMI (07/25), DM, substance use and HFrEF.   Admitted 08/03/23 with chest pain left-sided radiating to the jaw and becoming diaphoretic on and off since 3 o'clock this morning which woke him up. Cardiology consultation done.  Initially started on IV heparin  drip and nitroglycerin  drip. He underwent emergent cardiac cath 08/03/23 and had stent placement in the mid left circumflex. Echo 08/03/23: EF 45-50%, severe LVH, moderate hypokinesis, normal RV, mild Jeremiah. 1 dose of IV lasix  given and then placed on oral diuretics.   He presents today for his initial visit with a chief complaint of fatigue. Has associated shortness of breath with little exertion, intermittent chest pain, dizziness, occasional palpitations. Interrupted sleep with waking every couple of hours. Sleeping on 2 pillows.   Smoking on occasion, rare alcohol use, marijuana sometimes. Denies cocaine  Review of Systems: [y] = yes, [ ]  = no   General: Weight gain [ ] ; Weight loss [ ] ; Anorexia [ ] ; Fatigue [ y]; Fever [ ] ; Chills [ ] ; Weakness [ ]   Cardiac: Chest pain/pressure [ y]; Resting SOB [ ] ; Exertional SOB Davis.Dad ]; Orthopnea [ ] ; Pedal Edema [ ] ; Palpitations Davis.Dad ]; Syncope [ ] ; Presyncope [ ] ; Paroxysmal nocturnal dyspnea[ ]   Pulmonary: Cough [ ] ; Wheezing[ ] ; Hemoptysis[ ] ; Sputum [ ] ; Snoring [ ]   GI: Vomiting[ ] ; Dysphagia[ ] ; Melena[ ] ; Hematochezia [ ] ; Heartburn[ ] ; Abdominal pain [ ] ; Constipation [ ] ; Diarrhea [ ] ; BRBPR [ ]   GU: Hematuria[ ] ; Dysuria [ ] ; Nocturia[ ]   Vascular: Pain in legs with walking [ ] ; Pain in feet with lying flat [ ] ; Non-healing sores [ ] ; Stroke [ ] ; TIA [ ] ; Slurred speech [ ] ;  Neuro: Dizziness[y ]; Vertigo[ ] ; Seizures[ ] ; Paresthesias[ ] ;Blurred vision [ ] ; Diplopia [ ] ; Vision changes [ ]    Ortho/Skin: Arthritis [ ] ; Joint pain [ ] ; Muscle pain [ ] ; Joint swelling [ ] ; Back Pain [ ] ; Rash [ ]   Psych: Depression[ ] ; Anxiety[ ]   Heme: Bleeding problems [ ] ; Clotting disorders [ ] ; Anemia [ ]   Endocrine: Diabetes [ y]; Thyroid dysfunction[ ]    Past Medical History:  Diagnosis Date   Diabetes mellitus without complication (HCC)    type 2   Diverticulitis    Hyperlipidemia    Hypertension     Current Outpatient Medications  Medication Sig Dispense Refill   acetaminophen  (TYLENOL ) 500 MG tablet Take 500 mg by mouth every 6 (six) hours as needed for mild pain.     aspirin  EC 81 MG tablet Take 1 tablet (81 mg total) by mouth daily. Swallow whole. 30 tablet 12   atorvastatin  (LIPITOR ) 80 MG tablet Take 1 tablet (80 mg total) by mouth daily. 90 tablet 1   BD PEN NEEDLE SHORT ULTRAFINE 31G X 8 MM MISC USE AS DIRECTED 100 each 1   carvedilol  (COREG ) 3.125 MG tablet Take 1 tablet (3.125 mg total) by mouth 2 (two) times daily with a meal. 60 tablet 0   ezetimibe  (ZETIA ) 10 MG tablet Take 1 tablet (10 mg total) by mouth daily. 30 tablet 11   ezetimibe  (ZETIA ) 10 MG tablet Take 1 tablet (10 mg total) by mouth daily. 90 tablet 3   furosemide  (LASIX ) 20 MG tablet Take 1 tablet (20  mg total) by mouth daily. 30 tablet 0   gabapentin  (NEURONTIN ) 100 MG capsule Take 1-2 capsules (100-200 mg total) by mouth 3 (three) times daily. 180 capsule 2   insulin  detemir (LEVEMIR  FLEXTOUCH) 100 UNIT/ML FlexPen Inject 65 Units into the skin daily. (Patient not taking: Reported on 08/04/2023) 15 mL 11   losartan  (COZAAR ) 100 MG tablet Take 1 tablet (100 mg total) by mouth daily. 90 tablet 1   metFORMIN  (GLUCOPHAGE ) 1000 MG tablet Take 1 tablet (1,000 mg total) by mouth 2 (two) times daily with a meal. 180 tablet 1   nitroGLYCERIN  (NITROSTAT ) 0.4 MG SL tablet Place 1 tablet (0.4 mg total) under the tongue every 5 (five) minutes x 3 doses as needed for chest pain. 25 tablet 0   prasugrel  (EFFIENT ) 10 MG TABS  tablet Take 1 tablet (10 mg total) by mouth daily. 30 tablet 0   Semaglutide ,0.25 or 0.5MG /DOS, (OZEMPIC , 0.25 OR 0.5 MG/DOSE,) 2 MG/1.5ML SOPN Inject 0.25 mg into the skin once a week. Start with 0.25MG  once a week x 4 weeks, then increase to 0.5MG  weekly. 3 mL 0   No current facility-administered medications for this visit.    Allergies  Allergen Reactions   Penicillins Nausea Only    Has patient had a PCN reaction causing immediate rash, facial/tongue/throat swelling, SOB or lightheadedness with hypotension: No Has patient had a PCN reaction causing severe rash involving mucus membranes or skin necrosis: No Has patient had a PCN reaction that required hospitalization: No Has patient had a PCN reaction occurring within the last 10 years: Yes If all of the above answers are NO, then may proceed with Cephalosporin use.       Social History   Socioeconomic History   Marital status: Single    Spouse name: Not on file   Number of children: Not on file   Years of education: Not on file   Highest education level: 12th grade  Occupational History   Not on file  Tobacco Use   Smoking status: Some Days    Current packs/day: 1.00    Types: Cigarettes   Smokeless tobacco: Never  Vaping Use   Vaping status: Never Used  Substance and Sexual Activity   Alcohol use: Yes    Comment: Fifth of liquor per week, usually on Fridays   Drug use: Yes    Types: Marijuana, Cocaine   Sexual activity: Not on file  Other Topics Concern   Not on file  Social History Narrative   ** Merged History Encounter **       Social Drivers of Health   Financial Resource Strain: Patient Declined (05/05/2023)   Overall Financial Resource Strain (CARDIA)    Difficulty of Paying Living Expenses: Patient declined  Food Insecurity: Patient Declined (08/03/2023)   Hunger Vital Sign    Worried About Running Out of Food in the Last Year: Patient declined    Ran Out of Food in the Last Year: Patient declined   Transportation Needs: Patient Declined (08/03/2023)   PRAPARE - Administrator, Civil Service (Medical): Patient declined    Lack of Transportation (Non-Medical): Patient declined  Physical Activity: Unknown (05/05/2023)   Exercise Vital Sign    Days of Exercise per Week: 0 days    Minutes of Exercise per Session: Not on file  Stress: No Stress Concern Present (05/05/2023)   [employer] of Occupational Health - Occupational Stress Questionnaire    Feeling of Stress : Only a little  Social  Connections: Patient Declined (08/03/2023)   Social Connection and Isolation Panel    Frequency of Communication with Friends and Family: Patient declined    Frequency of Social Gatherings with Friends and Family: Patient declined    Attends Religious Services: Patient declined    Database administrator or Organizations: Patient declined    Attends Banker Meetings: Patient declined    Marital Status: Patient declined  Recent Concern: Social Connections - Socially Isolated (05/05/2023)   Social Connection and Isolation Panel    Frequency of Communication with Friends and Family: Once a week    Frequency of Social Gatherings with Friends and Family: Once a week    Attends Religious Services: Never    Database administrator or Organizations: No    Attends Engineer, structural: Not on file    Marital Status: Living with partner  Intimate Partner Violence: Patient Declined (08/03/2023)   Humiliation, Afraid, Rape, and Kick questionnaire    Fear of Current or Ex-Partner: Patient declined    Emotionally Abused: Patient declined    Physically Abused: Patient declined    Sexually Abused: Patient declined      Family History  Problem Relation Age of Onset   Heart attack Father    Vitals:   08/06/23 1337  BP: 118/73  Pulse: 90  SpO2: 98%  Weight: 246 lb 6.4 oz (111.8 kg)   Wt Readings from Last 3 Encounters:  08/06/23 246 lb 6.4 oz (111.8 kg)  08/03/23 255 lb  11.7 oz (116 kg)  07/13/23 251 lb 12.8 oz (114.2 kg)   Lab Results  Component Value Date   CREATININE 1.15 08/06/2023   CREATININE 0.95 08/04/2023   CREATININE 1.03 08/03/2023   PHYSICAL EXAM:  General: Well appearing.  Cor: No JVD. Regular rhythm, rate.  Lungs: clear Abdomen: soft, nontender, nondistended. Extremities: 1+ pitting edema bilateral lower legs Neuro:. Affect pleasant, anxious   ECG: not done  ReDs reading: 38 %, abnormal (see plan below)   ASSESSMENT & PLAN:  1: ICM with reduced ejection fraction- - cath 08/03/23: subtotally occluded mid LCx successfully treated with 2 overlapping DES  - NYHA class III - mildly fluid up with elevated ReDs and symptoms - ReDs 38% - increase furosemide  to 40mg  daily. BMET today to assess need for potassium - not weighing daily but will get scales. Nurse navigator reviewed parameters to call about - Echo 08/03/23: EF 45-50%, severe LVH, moderate hypokinesis, normal RV, mild Jeremiah - continue carvedilol  3.125mg  BID - continue losartan  100mg  daily; may transition to entresto in future - plan to start SGLT2 at next visit then MRA - reviewed keeping daily fluid intake to 60-64 ounces   2: NSTEMI- - cath 08/03/23: subtotally occluded mid LCx successfully treated with 2 overlapping DES  - to see cardiology 08/25 - continue aspirin  and prasugrel  for at least 12 months (07/26) - Cardiac rehab referral was already placed during recent admission.   3: HTN- - BP 118/73 - sees PCP Elenore) - BMET 08/04/23 reviewed: sodium 134, potassium 3.8, creatinine 0.95 and GFR >60 - BMET today  4: DM (managed by PCP)- - A1c 07/13/23 was 9.9% - continue metformin , insulin   5: Hyperlipidemia- - LDL 08/03/23 was 73 - lipo (a) 08/04/23 was 103.5 - continue atorvastatin  80mg  daily - continue zetia  10mg  daily  6: Substance use- - occasional tobacco - marijuana use sometimes   Return in 2 weeks, sooner if needed.   Ellouise DELENA Class,  FNP 08/05/23

## 2023-08-05 NOTE — Transitions of Care (Post Inpatient/ED Visit) (Signed)
 08/05/2023  Patient ID: [name], male   DOB: 06-07-1970, 53 y.o.   MRN: [id number]  Chart Review for transitions of care. Patient in observation status.  Argusta Mcgann J. Fabrice Dyal RN, MSN [facility], [facility] RN Care Manager Direct Dial: [phone]  Fax: [phone] Website: [email or web address]

## 2023-08-06 ENCOUNTER — Ambulatory Visit: Payer: Self-pay | Admitting: Family

## 2023-08-06 ENCOUNTER — Other Ambulatory Visit
Admission: RE | Admit: 2023-08-06 | Discharge: 2023-08-06 | Disposition: A | Discharge status: Home / Self Care | Source: Ambulatory Visit | Attending: Family | Admitting: Family

## 2023-08-06 ENCOUNTER — Ambulatory Visit: Admitting: Family

## 2023-08-06 ENCOUNTER — Encounter: Payer: Self-pay | Admitting: Family

## 2023-08-06 VITALS — BP 118/73 | HR 90 | Wt 246.4 lb

## 2023-08-06 DIAGNOSIS — I10 Essential (primary) hypertension: Secondary | ICD-10-CM

## 2023-08-06 DIAGNOSIS — E782 Mixed hyperlipidemia: Secondary | ICD-10-CM

## 2023-08-06 DIAGNOSIS — I214 Non-ST elevation (NSTEMI) myocardial infarction: Secondary | ICD-10-CM

## 2023-08-06 DIAGNOSIS — E119 Type 2 diabetes mellitus without complications: Secondary | ICD-10-CM | POA: Diagnosis not present

## 2023-08-06 DIAGNOSIS — I5022 Chronic systolic (congestive) heart failure: Secondary | ICD-10-CM | POA: Diagnosis not present

## 2023-08-06 DIAGNOSIS — Z794 Long term (current) use of insulin: Secondary | ICD-10-CM

## 2023-08-06 DIAGNOSIS — Z720 Tobacco use: Secondary | ICD-10-CM

## 2023-08-06 LAB — BASIC METABOLIC PANEL WITH GFR
Anion gap: 12 (ref 5–15)
BUN: 21 mg/dL — ABNORMAL HIGH (ref 6–20)
CO2: 24 mmol/L (ref 22–32)
Calcium: 9.7 mg/dL (ref 8.9–10.3)
Chloride: 100 mmol/L (ref 98–111)
Creatinine, Ser: 1.15 mg/dL (ref 0.61–1.24)
GFR, Estimated: >60 mL/min (ref >60)
Glucose, Bld: 350 mg/dL — ABNORMAL HIGH (ref 70–99)
Potassium: 3.8 mmol/L (ref 3.5–5.1)
Sodium: 136 mmol/L (ref 135–145)

## 2023-08-06 MED ORDER — FUROSEMIDE 20 MG PO TABS: 40.0000 mg | ORAL_TABLET | Freq: Every day | ORAL | 5 refills | Status: DC

## 2023-08-06 MED ORDER — POTASSIUM CHLORIDE CRYS ER 20 MEQ PO TBCR
20.0000 meq | EXTENDED_RELEASE_TABLET | Freq: Every day | ORAL | 3 refills | Status: DC
Start: 2023-08-06 — End: 2023-08-25

## 2023-08-06 NOTE — Progress Notes (Signed)
 ReDS Vest / Clip - 08/06/23 1337       ReDS Vest / Clip   Station Marker D    Ruler Value 35    ReDS Value Range Moderate volume overload    ReDS Actual Value 38

## 2023-08-06 NOTE — Patient Instructions (Signed)
 Medication Changes:  INCREASE LASIX  TO 40 MG ONCE DAILY  Lab Work:  Go over to the [facility]. Go pass the gift shop and have your blood work completed.  We will only call you if the results are abnormal or if the provider would like to make medication changes.  No news is good news.   Follow-Up in: 2 weeks with Ellouise Class, FNP.   Thank you for choosing [facility].    At the [facility], you and your health needs are our priority. We have a designated team specialized in the treatment of Heart Failure. This Care Team includes your primary Heart Failure Specialized Cardiologist (physician), Advanced Practice Providers (APPs- Physician Assistants and Nurse Practitioners), and Pharmacist who all work together to provide you with the care you need, when you need it.   You may see any of the following providers on your designated Care Team at your next follow up:  Dr. Toribio Fuel Dr. Ezra Shuck Dr. Ria Commander Dr. Morene Brownie Ellouise Class, FNP Jaun Bash, RPH-CPP  Please be sure to bring in all your medications bottles to every appointment.   Need to Contact Us :  If you have any questions or concerns before your next appointment please send us  a message through [place] or call our office at [phone].    TO LEAVE A MESSAGE FOR THE NURSE SELECT OPTION 2, PLEASE LEAVE A MESSAGE INCLUDING: YOUR NAME DATE OF BIRTH CALL BACK NUMBER REASON FOR CALL**this is important as we prioritize the call backs  YOU WILL RECEIVE A CALL BACK THE SAME DAY AS LONG AS YOU CALL BEFORE 4:00 PM

## 2023-08-06 NOTE — Progress Notes (Signed)
 Heart Failure Nurse Navigator Progress Note  PCP: Kayla Jeoffrey RAMAN, FNP PCP-Cardiologist: Lonni Hanson, MD Admission Diagnosis: NSTEMI (non-ST elevated myocardial infarction) [facility]) Admitted from: Home  Patient and significant other present at [facility] for [facility] appointment.  Navigator was unable to see patient while inpatient because he was being discharged at the time. Heart Failure Education booklet given to him prior to discharge.  All heart failure education reviewed at clinic follow-up and understood all information provided.  Presentation:   Jeremiah Neal presented with sudden onset left-sided tight chest pain that radiated into his jaw that started in the early morning while sleeping that woke him up. He had also had shortness of breath but no nausea, vomiting, diaphoresis, dizziness, fever or cough.  Per note, patient received aspirin  from the fire department and Nitropaste from EMS. EKG with EMS showed sinus tachycardia.  CT-No acute findings in the chest, abdomen or pelvis. Large right hydrocele, Left colonic diverticulosis without diverticulitis, Aortic atherosclerosis. History of hypertension, diabetes mellitus type 2, hyperlipidemia and polysubstance use. HS-Troponin-73.  ECHO/ LVEF: 45-50%  Clinical Course:  Past Medical History:  Diagnosis Date   Diabetes mellitus without complication (HCC)    type 2   Diverticulitis    Hyperlipidemia    Hypertension      Social History   Socioeconomic History   Marital status: Single    Spouse name: Not on file   Number of children: Not on file   Years of education: Not on file   Highest education level: 12th grade  Occupational History   Not on file  Tobacco Use   Smoking status: Some Days    Current packs/day: 1.00    Types: Cigarettes   Smokeless tobacco: Never  Vaping Use   Vaping status: Never Used  Substance and Sexual Activity   Alcohol use: Yes    Comment: Fifth of liquor per week, usually on  Fridays   Drug use: Yes    Types: Marijuana, Cocaine   Sexual activity: Not on file  Other Topics Concern   Not on file  Social History Narrative   ** Merged History Encounter **       Social Drivers of Health   Financial Resource Strain: High Risk (08/06/2023)   Overall Financial Resource Strain (CARDIA)    Difficulty of Paying Living Expenses: Very hard  Food Insecurity: Food Insecurity Present (08/06/2023)   Hunger Vital Sign    Worried About Running Out of Food in the Last Year: Sometimes true    Ran Out of Food in the Last Year: Sometimes true  Transportation Needs: No Transportation Needs (08/06/2023)   PRAPARE - Administrator, Civil Service (Medical): No    Lack of Transportation (Non-Medical): No  Physical Activity: Unknown (05/05/2023)   Exercise Vital Sign    Days of Exercise per Week: 0 days    Minutes of Exercise per Session: Not on file  Stress: No Stress Concern Present (05/05/2023)   [employer] of Occupational Health - Occupational Stress Questionnaire    Feeling of Stress : Only a little  Social Connections: Patient Declined (08/03/2023)   Social Connection and Isolation Panel    Frequency of Communication with Friends and Family: Patient declined    Frequency of Social Gatherings with Friends and Family: Patient declined    Attends Religious Services: Patient declined    Database administrator or Organizations: Patient declined    Attends Banker Meetings: Patient declined  Marital Status: Patient declined  Recent Concern: Social Connections - Socially Isolated (05/05/2023)   Social Connection and Isolation Panel    Frequency of Communication with Friends and Family: Once a week    Frequency of Social Gatherings with Friends and Family: Once a week    Attends Religious Services: Never    Database administrator or Organizations: No    Attends Engineer, structural: Not on file    Marital Status: Living with partner    Education Assessment and Provision:  Detailed education and instructions provided on heart failure disease management including the following:  Signs and symptoms of Heart Failure When to call the physician Importance of daily weights Low sodium diet Fluid restriction Medication management Anticipated future follow-up appointments  Patient education given on each of the above topics.  Patient acknowledges understanding via teach back method and acceptance of all instructions.  Education Materials:  Living Better With Heart Failure Booklet, HF zone tool, & Daily Weight Tracker Tool.  Patient has scale at home: No. Significant other said they could purchase one. Patient has pill box at home: Yes    High Risk Criteria for Readmission and/or Poor Patient Outcomes: Heart failure hospital admissions (last 6 months): 1  No Show rate: 6% Difficult social situation: History of polysubstance use Demonstrates medication adherence: Yes.  Significant other prepares patient pill box for him.   Primary Language: English Literacy level: Reading, Writing, Comprehension  Barriers of Care:   Food resources and rent payments have been concerning at times for patient and significant others.  Considerations/Referrals:   Referral made to Heart Failure Pharmacist Stewardship: Yes-visited while in the hospital. Referral made to Heart Failure CSW/NCM TOC: No Referral made to Heart & Vascular TOC clinic: Yes. 08/06/23  Items for Follow-up on DC/TOC: Diet & fluid restrictions Daily weights Polysubstance cessation Continued heart failure education  Charmaine Pines, RN, BSN [facility] Heart Failure Navigator Secure Chat Only

## 2023-08-07 ENCOUNTER — Encounter: Payer: Self-pay | Admitting: Family

## 2023-08-09 ENCOUNTER — Encounter: Payer: Self-pay | Admitting: Family

## 2023-08-11 DIAGNOSIS — M5412 Radiculopathy, cervical region: Secondary | ICD-10-CM | POA: Diagnosis not present

## 2023-08-11 MED ORDER — SPIRONOLACTONE 25 MG PO TABS: 25.0000 mg | ORAL_TABLET | Freq: Every day | ORAL | 3 refills | Status: AC

## 2023-08-12 ENCOUNTER — Encounter: Payer: Self-pay | Admitting: Family Medicine

## 2023-08-12 ENCOUNTER — Ambulatory Visit: Admitting: Family Medicine

## 2023-08-12 ENCOUNTER — Encounter (HOSPITAL_COMMUNITY): Payer: Self-pay | Admitting: Cardiology

## 2023-08-12 VITALS — BP 124/80 | HR 90 | Temp 97.6°F | Ht 70.0 in | Wt 248.6 lb

## 2023-08-12 DIAGNOSIS — E1159 Type 2 diabetes mellitus with other circulatory complications: Secondary | ICD-10-CM

## 2023-08-12 DIAGNOSIS — I5021 Acute systolic (congestive) heart failure: Secondary | ICD-10-CM

## 2023-08-12 DIAGNOSIS — Z794 Long term (current) use of insulin: Secondary | ICD-10-CM

## 2023-08-12 DIAGNOSIS — R42 Dizziness and giddiness: Secondary | ICD-10-CM | POA: Diagnosis not present

## 2023-08-12 DIAGNOSIS — E1169 Type 2 diabetes mellitus with other specified complication: Secondary | ICD-10-CM | POA: Diagnosis not present

## 2023-08-12 DIAGNOSIS — I152 Hypertension secondary to endocrine disorders: Secondary | ICD-10-CM | POA: Diagnosis not present

## 2023-08-12 DIAGNOSIS — E785 Hyperlipidemia, unspecified: Secondary | ICD-10-CM | POA: Diagnosis not present

## 2023-08-12 DIAGNOSIS — I252 Old myocardial infarction: Secondary | ICD-10-CM

## 2023-08-12 DIAGNOSIS — E1165 Type 2 diabetes mellitus with hyperglycemia: Secondary | ICD-10-CM | POA: Diagnosis not present

## 2023-08-12 LAB — COMPREHENSIVE METABOLIC PANEL WITH GFR
AG Ratio: 1.8 (calc) (ref 1.0–2.5)
ALT: 29 U/L (ref 9–46)
AST: 19 U/L (ref 10–35)
Albumin: 4.4 g/dL (ref 3.6–5.1)
Alkaline phosphatase (APISO): 90 U/L (ref 35–144)
BUN: 24 mg/dL (ref 7–25)
CO2: 31 mmol/L (ref 20–32)
Calcium: 10.3 mg/dL (ref 8.6–10.3)
Chloride: 97 mmol/L — ABNORMAL LOW (ref 98–110)
Creat: 1.16 mg/dL (ref 0.70–1.30)
Globulin: 2.5 g/dL (ref 1.9–3.7)
Glucose, Bld: 260 mg/dL — ABNORMAL HIGH (ref 65–99)
Potassium: 4.9 mmol/L (ref 3.5–5.3)
Sodium: 138 mmol/L (ref 135–146)
Total Bilirubin: 0.4 mg/dL (ref 0.2–1.2)
Total Protein: 6.9 g/dL (ref 6.1–8.1)
eGFR: 75 mL/min/1.73m2 (ref >=60)

## 2023-08-12 LAB — CBC WITH DIFFERENTIAL/PLATELET
Absolute Lymphocytes: 2512 {cells}/uL (ref 850–3900)
Absolute Monocytes: 708 {cells}/uL (ref 200–950)
Basophils Absolute: 46 {cells}/uL (ref 0–200)
Basophils Relative: 0.5 %
Eosinophils Absolute: 221 {cells}/uL (ref 15–500)
Eosinophils Relative: 2.4 %
HCT: 49.3 % (ref 38.5–50.0)
Hemoglobin: 15.6 g/dL (ref 13.2–17.1)
MCH: 27.4 pg (ref 27.0–33.0)
MCHC: 31.6 g/dL — ABNORMAL LOW (ref 32.0–36.0)
MCV: 86.6 fL (ref 80.0–100.0)
MPV: 10.5 fL (ref 7.5–12.5)
Monocytes Relative: 7.7 %
Neutro Abs: 5713 {cells}/uL (ref 1500–7800)
Neutrophils Relative %: 62.1 %
Platelets: 303 Thousand/uL (ref 140–400)
RBC: 5.69 Million/uL (ref 4.20–5.80)
RDW: 14.6 % (ref 11.0–15.0)
Total Lymphocyte: 27.3 %
WBC: 9.2 Thousand/uL (ref 3.8–10.8)

## 2023-08-12 LAB — GLUCOSE 16585: Glucose: 286 mg/dL — ABNORMAL HIGH (ref 65–99)

## 2023-08-12 NOTE — Assessment & Plan Note (Signed)
 EKG unchanged from hospitalization. CBC and CMP today. BG elevated, needs better control, will start Ozempic  when able. Discussed red flags and when to seek medical care.

## 2023-08-12 NOTE — Assessment & Plan Note (Signed)
 Continue Atorvastatin  80mg  daily and Zetia  10mg  daily

## 2023-08-12 NOTE — Assessment & Plan Note (Signed)
 Monitoring weights, limiting fluid to 64 ounces daily, low sodium diet, compliance with medications  Per Cardiology:  ICM with reduced ejection fraction- - cath 08/03/23: subtotally occluded mid LCx successfully treated with 2 overlapping DES  - NYHA class III - mildly fluid up with elevated ReDs and symptoms - ReDs 38% - increase furosemide  to 40mg  daily. BMET today to assess need for potassium - not weighing daily but will get scales. Nurse navigator reviewed parameters to call about - Echo 08/03/23: EF 45-50%, severe LVH, moderate hypokinesis, normal RV, mild MR - continue carvedilol  3.125mg  BID - continue losartan  100mg  daily; may transition to entresto in future - plan to start SGLT2 at next visit then MRA - reviewed keeping daily fluid intake to 60-64 ounces

## 2023-08-12 NOTE — Assessment & Plan Note (Signed)
 BP well controlled. Continue Carvedilol  3.125mg  BID, Losartan  100mg  daily, Spironolactone  25mg  daily. Denies chest pain, palpitations, recurrent headaches, vision changes, lightheadedness, dizziness, dyspnea on exertion, or swelling of extremities. Follow up in 1 month, keep appts with cardiology

## 2023-08-12 NOTE — Assessment & Plan Note (Signed)
 Per cardiology: NSTEMI- - cath 08/03/23: subtotally occluded mid LCx successfully treated with 2 overlapping DES  - to see cardiology 08/25 - continue aspirin  and prasugrel  for at least 12 months (07/26) - Cardiac rehab referral was already placed during recent admission.

## 2023-08-12 NOTE — Assessment & Plan Note (Signed)
 Continue Basaglar , start Ozempic  when able when symptoms subside. Follow up in 1 month, keep appt with Endo

## 2023-08-12 NOTE — Progress Notes (Signed)
 Subjective:  HPI: Jeremiah Neal is a 53 y.o. male presenting on 08/12/2023 for Medical Management of Chronic Issues (Discuss insulin /Feels dizzy, nausea, headache since taking so many meds in the morning /Cardiology is Dr. END )   HPI Patient is in today for hospital follow-up. Was hospitalized from 7/21-7/22 for NSTEMI requiring stent placement to his left mid circumflex. Newly diagnosed with CHF mild systolic with EF 54%. Was started on ASA, Effient , PO Lasix , Atorvastatin , Carvedilol , Zetia , and nitroglycerin  SL tablets PRN. Amlodipine  DC. 7/29 contacted HF clinic with BP elevations and Spironolactone  was added. Has not yet started Ozempic . Is no longer smoking or using alcohol or drugs.  Has been seen at heart failure center since hospitalization Reports today feeling lightheaded, dizzy, nausea in AM after taking new medications, BG has been 180-230s in AM. Denies chest pain, palpitations, headaches, vision changes.  Is limiting fluids to 60-64 ounces daily, daily weights, low sodium diet.  Has cardiac rehab referral Scheduled appts with cardiology and HF clinic upcoming Needs to schedule with Endo.   Discharge Summary 08/04/2023 for reference only: Discharge Diagnoses: Principal Problem:   NSTEMI (non-ST elevated myocardial infarction) (HCC) Active Problems:   Hypertension   Hyperlipidemia   DM (diabetes mellitus), type 2 with complications (HCC)   Acute systolic CHF (congestive heart failure) (HCC)    Jeremiah Neal is a 53 y.o. male with medical history significant of hypertension, diabetes, hyperlipidemia comes to the emergency room accompanied by his wife with complaints of chest pain left-sided radiating to the jaw and becoming diaphoretic on and off since 3 o'clock this morning which woke him up.   NSTEMI CHF mild systolic with EF 54% -- risk factors obesity, uncontrolled diabetes and hypertension along with hyperlipidemia -- cardiology consultation with [facility] cardiology Dr.  Mady -- patient initially was started on IV heparin  drip and nitroglycerin  drip. He underwent emergent cardiac cath and had stent placement in the mid left circumflex. -- Now on aspirin  and Effient  --cont GDMT -- continue statins, zetia  -- received IV Lasix  times 1. will start patient on PO Lasix  20 mg daily per cardiology recommendation -- okay to go home today per cardiology. Patient is improving. He is agreeable.   Hypertension -- follow cardiology recommendation as above   Hyperlipidemia -- statins and Zetia    Type II diabetes, hyperglycemia, uncontrolled, intermittent noncompliance to meds -- hemoglobin A1c 9.9 -- resume long-acting insulin  and sliding scale -- patient also takes metformin  at home will hold off since he had cath for 48 hours   Polysubstance use -- smokes tobacco and marijuana -- recommend cessation   Patient will discharge to home. No family at bedside this morning. I will follow-up with cardiology as outpatient. Dietitian will see patient prior to discharge.  HF Clinic OV 08/06/2023 for reference only: ASSESSMENT & PLAN:   1: ICM with reduced ejection fraction- - cath 08/03/23: subtotally occluded mid LCx successfully treated with 2 overlapping DES  - NYHA class III - mildly fluid up with elevated ReDs and symptoms - ReDs 38% - increase furosemide  to 40mg  daily. BMET today to assess need for potassium - not weighing daily but will get scales. Nurse navigator reviewed parameters to call about - Echo 08/03/23: EF 45-50%, severe LVH, moderate hypokinesis, normal RV, mild MR - continue carvedilol  3.125mg  BID - continue losartan  100mg  daily; may transition to entresto in future - plan to start SGLT2 at next visit then MRA - reviewed keeping daily fluid intake to 60-64 ounces  2: NSTEMI- - cath 08/03/23: subtotally occluded mid LCx successfully treated with 2 overlapping DES  - to see cardiology 08/25 - continue aspirin  and prasugrel  for at least 12 months  (07/26) - Cardiac rehab referral was already placed during recent admission.    3: HTN- - BP 118/73 - sees PCP Elenore) - BMET 08/04/23 reviewed: sodium 134, potassium 3.8, creatinine 0.95 and GFR >60 - BMET today   4: DM (managed by PCP)- - A1c 07/13/23 was 9.9% - continue metformin , insulin    5: Hyperlipidemia- - LDL 08/03/23 was 73 - lipo (a) 08/04/23 was 103.5 - continue atorvastatin  80mg  daily - continue zetia  10mg  daily   6: Substance use- - occasional tobacco - marijuana use sometimes     Return in 2 weeks, sooner if needed.   Review of Systems  All other systems reviewed and are negative.   Relevant past medical history reviewed and updated as indicated.   Past Medical History:  Diagnosis Date   Diabetes mellitus without complication (HCC)    type 2   Diverticulitis    Hyperlipidemia    Hypertension      Past Surgical History:  Procedure Laterality Date   CORONARY STENT INTERVENTION N/A 08/03/2023   Procedure: CORONARY STENT INTERVENTION;  Surgeon: Mady Bruckner, MD;  Location: [facility] INVASIVE CV LAB;  Service: Cardiovascular;  Laterality: N/A;   LEFT HEART CATH AND CORONARY ANGIOGRAPHY N/A 08/03/2023   Procedure: LEFT HEART CATH AND CORONARY ANGIOGRAPHY;  Surgeon: Mady Bruckner, MD;  Location: [facility] INVASIVE CV LAB;  Service: Cardiovascular;  Laterality: N/A;    Allergies and medications reviewed and updated.   Current Outpatient Medications:    acetaminophen  (TYLENOL ) 500 MG tablet, Take 500 mg by mouth every 6 (six) hours as needed for mild pain., Disp: , Rfl:    aspirin  EC 81 MG tablet, Take 1 tablet (81 mg total) by mouth daily. Swallow whole., Disp: 30 tablet, Rfl: 12   atorvastatin  (LIPITOR ) 80 MG tablet, Take 1 tablet (80 mg total) by mouth daily., Disp: 90 tablet, Rfl: 1   BD PEN NEEDLE SHORT ULTRAFINE 31G X 8 MM MISC, USE AS DIRECTED, Disp: 100 each, Rfl: 1   carvedilol  (COREG ) 3.125 MG tablet, Take 1 tablet (3.125 mg total) by mouth 2 (two)  times daily with a meal., Disp: 60 tablet, Rfl: 0   ezetimibe  (ZETIA ) 10 MG tablet, Take 1 tablet (10 mg total) by mouth daily., Disp: 30 tablet, Rfl: 11   furosemide  (LASIX ) 20 MG tablet, Take 2 tablets (40 mg total) by mouth daily., Disp: 60 tablet, Rfl: 5   gabapentin  (NEURONTIN ) 100 MG capsule, Take 1-2 capsules (100-200 mg total) by mouth 3 (three) times daily., Disp: 180 capsule, Rfl: 2   insulin  detemir (LEVEMIR  FLEXTOUCH) 100 UNIT/ML FlexPen, Inject 65 Units into the skin daily., Disp: 15 mL, Rfl: 11   Insulin  Glargine (BASAGLAR  KWIKPEN) 100 UNIT/ML, Inject 100 Units into the skin daily., Disp: , Rfl:    losartan  (COZAAR ) 100 MG tablet, Take 1 tablet (100 mg total) by mouth daily., Disp: 90 tablet, Rfl: 1   meloxicam (MOBIC) 15 MG tablet, Take 15 mg by mouth daily., Disp: , Rfl:    metFORMIN  (GLUCOPHAGE ) 1000 MG tablet, Take 1 tablet (1,000 mg total) by mouth 2 (two) times daily with a meal., Disp: 180 tablet, Rfl: 1   nitroGLYCERIN  (NITROSTAT ) 0.4 MG SL tablet, Place 1 tablet (0.4 mg total) under the tongue every 5 (five) minutes x 3 doses as needed for chest pain.,  Disp: 25 tablet, Rfl: 0   potassium chloride  SA (KLOR-CON  M) 20 MEQ tablet, Take 1 tablet (20 mEq total) by mouth daily., Disp: 90 tablet, Rfl: 3   prasugrel  (EFFIENT ) 10 MG TABS tablet, Take 1 tablet (10 mg total) by mouth daily., Disp: 30 tablet, Rfl: 0   spironolactone  (ALDACTONE ) 25 MG tablet, Take 1 tablet (25 mg total) by mouth daily., Disp: 90 tablet, Rfl: 3   Semaglutide ,0.25 or 0.5MG /DOS, (OZEMPIC , 0.25 OR 0.5 MG/DOSE,) 2 MG/1.5ML SOPN, Inject 0.25 mg into the skin once a week. Start with 0.25MG  once a week x 4 weeks, then increase to 0.5MG  weekly. (Patient not taking: Reported on 08/12/2023), Disp: 3 mL, Rfl: 0  Allergies  Allergen Reactions   Penicillins Nausea Only    Has patient had a PCN reaction causing immediate rash, facial/tongue/throat swelling, SOB or lightheadedness with hypotension: No Has patient had a  PCN reaction causing severe rash involving mucus membranes or skin necrosis: No Has patient had a PCN reaction that required hospitalization: No Has patient had a PCN reaction occurring within the last 10 years: Yes If all of the above answers are NO, then may proceed with Cephalosporin use.    Penicillin G Rash   Penicillin G Procaine Rash    Objective:   BP 124/80 Comment: wrist  Pulse 90   Temp 97.6 F (36.4 C)   Ht 5' 10 (1.778 m)   Wt 248 lb 9.6 oz (112.8 kg)   SpO2 96%   BMI 35.67 kg/m      08/12/2023    2:47 PM 08/12/2023    2:46 PM 08/12/2023    2:30 PM  Vitals with BMI  Height   5' 10  Weight   248 lbs 10 oz  BMI   35.67  Systolic 124 120 897  Diastolic 80 77 72  Pulse 90 91 95     Physical Exam Vitals and nursing note reviewed.  Constitutional:      Appearance: Normal appearance. He is normal weight.  HENT:     Head: Normocephalic and atraumatic.  Cardiovascular:     Rate and Rhythm: Normal rate and regular rhythm.     Pulses: Normal pulses.     Heart sounds: Normal heart sounds.  Pulmonary:     Effort: Pulmonary effort is normal.     Breath sounds: Normal breath sounds.  Skin:    General: Skin is warm and dry.     Capillary Refill: Capillary refill takes less than 2 seconds.  Neurological:     General: No focal deficit present.     Mental Status: He is alert and oriented to person, place, and time. Mental status is at baseline.  Psychiatric:        Mood and Affect: Mood normal.        Behavior: Behavior normal.        Thought Content: Thought content normal.        Judgment: Judgment normal.     Assessment & Plan:  History of non-ST elevation myocardial infarction (NSTEMI) Assessment & Plan: Per cardiology: NSTEMI- - cath 08/03/23: subtotally occluded mid LCx successfully treated with 2 overlapping DES  - to see cardiology 08/25 - continue aspirin  and prasugrel  for at least 12 months (07/26) - Cardiac rehab referral was already placed  during recent admission.    Hyperlipidemia associated with type 2 diabetes mellitus (HCC) Assessment & Plan: Continue Atorvastatin  80mg  daily and Zetia  10mg  daily  Orders: -     CBC  with Differential/Platelet -     EKG 12-Lead -     Glucose, fingerstick (stat) -     Comprehensive metabolic panel with GFR  Hypertension associated with diabetes (HCC) Assessment & Plan: BP well controlled. Continue Carvedilol  3.125mg  BID, Losartan  100mg  daily, Spironolactone  25mg  daily. Denies chest pain, palpitations, recurrent headaches, vision changes, lightheadedness, dizziness, dyspnea on exertion, or swelling of extremities. Follow up in 1 month, keep appts with cardiology  Orders: -     CBC with Differential/Platelet -     EKG 12-Lead -     Glucose, fingerstick (stat) -     Comprehensive metabolic panel with GFR  Lightheadedness Assessment & Plan: EKG unchanged from hospitalization. CBC and CMP today. BG elevated, needs better control, will start Ozempic  when able. Discussed red flags and when to seek medical care.   Orders: -     CBC with Differential/Platelet -     EKG 12-Lead -     Glucose, fingerstick (stat) -     Comprehensive metabolic panel with GFR  Uncontrolled diabetes mellitus with hyperglycemia, with long-term current use of insulin  (HCC) Assessment & Plan: Continue Basaglar , start Ozempic  when able when symptoms subside. Follow up in 1 month, keep appt with Endo   Acute systolic CHF (congestive heart failure) (HCC) Assessment & Plan: Monitoring weights, limiting fluid to 64 ounces daily, low sodium diet, compliance with medications  Per Cardiology:  ICM with reduced ejection fraction- - cath 08/03/23: subtotally occluded mid LCx successfully treated with 2 overlapping DES  - NYHA class III - mildly fluid up with elevated ReDs and symptoms - ReDs 38% - increase furosemide  to 40mg  daily. BMET today to assess need for potassium - not weighing daily but will get  scales. Nurse navigator reviewed parameters to call about - Echo 08/03/23: EF 45-50%, severe LVH, moderate hypokinesis, normal RV, mild MR - continue carvedilol  3.125mg  BID - continue losartan  100mg  daily; may transition to entresto in future - plan to start SGLT2 at next visit then MRA - reviewed keeping daily fluid intake to 60-64 ounces    Other orders -     GLUCOSE [id number]     Follow up plan: Return in about 4 weeks (around 09/09/2023) for follow-up.  [name], FNP

## 2023-08-13 ENCOUNTER — Encounter: Payer: Self-pay | Admitting: Family Medicine

## 2023-08-13 MED ORDER — LOSARTAN POTASSIUM 100 MG PO TABS: 50.0000 mg | ORAL_TABLET | Freq: Two times a day (BID) | ORAL | 1 refills | Status: DC

## 2023-08-13 NOTE — Telephone Encounter (Signed)
 Patient aware of medication changes and voiced understanding-will monitor b/p  Unable to confirm spiro, will review medications and send mychart message or call office   As requested med changes also sent via mychart message

## 2023-08-14 NOTE — Telephone Encounter (Signed)
 Pt confirms  he did start spiro 25 mg daily, any additional changes to original plan

## 2023-08-17 ENCOUNTER — Other Ambulatory Visit: Payer: Self-pay | Admitting: Family Medicine

## 2023-08-17 DIAGNOSIS — E1169 Type 2 diabetes mellitus with other specified complication: Secondary | ICD-10-CM

## 2023-08-17 DIAGNOSIS — E1159 Type 2 diabetes mellitus with other circulatory complications: Secondary | ICD-10-CM

## 2023-08-17 DIAGNOSIS — E1165 Type 2 diabetes mellitus with hyperglycemia: Secondary | ICD-10-CM

## 2023-08-20 ENCOUNTER — Telehealth: Payer: Self-pay

## 2023-08-20 ENCOUNTER — Other Ambulatory Visit: Payer: Self-pay | Admitting: Family Medicine

## 2023-08-20 NOTE — Progress Notes (Signed)
 Care Guide Pharmacy Note  08/20/2023 Name: Jeremiah Neal MRN: [id number] DOB: [birth year]  Referred By: Kayla Jeoffrey RAMAN, FNP Reason for referral: Complex Care Management and Call Attempt #1 (Successful initial outreach scheduled with PHARM D- Lang)   [name] is a 53 y.o. year old male who is a primary care patient of Kayla Jeoffrey RAMAN, FNP.  [name] was referred to the pharmacist for assistance related to: medication assistance  Successful contact was made with the patient to discuss pharmacy services including being ready for the pharmacist to call at least 5 minutes before the scheduled appointment time and to have medication bottles and any blood pressure readings ready for review. The patient agreed to meet with the pharmacist via telephone visit on (date/time). 08/21/23 @ 2 PM   Leotis Cloria Davene Salome Romell Ralph Valrie, [facility] Guide  Direct Dial: [phone]  Fax [phone]

## 2023-08-21 ENCOUNTER — Encounter: Payer: Self-pay | Admitting: Student

## 2023-08-21 ENCOUNTER — Ambulatory Visit: Attending: Student | Admitting: Student

## 2023-08-21 ENCOUNTER — Telehealth: Payer: Self-pay | Admitting: Family Medicine

## 2023-08-21 ENCOUNTER — Other Ambulatory Visit: Payer: Self-pay

## 2023-08-21 VITALS — BP 132/88 | HR 92 | Ht 70.0 in | Wt 250.0 lb

## 2023-08-21 DIAGNOSIS — F1910 Other psychoactive substance abuse, uncomplicated: Secondary | ICD-10-CM | POA: Diagnosis not present

## 2023-08-21 DIAGNOSIS — I2510 Atherosclerotic heart disease of native coronary artery without angina pectoris: Secondary | ICD-10-CM | POA: Diagnosis not present

## 2023-08-21 DIAGNOSIS — I10 Essential (primary) hypertension: Secondary | ICD-10-CM | POA: Diagnosis not present

## 2023-08-21 DIAGNOSIS — Z720 Tobacco use: Secondary | ICD-10-CM | POA: Diagnosis not present

## 2023-08-21 DIAGNOSIS — E785 Hyperlipidemia, unspecified: Secondary | ICD-10-CM | POA: Insufficient documentation

## 2023-08-21 DIAGNOSIS — I5022 Chronic systolic (congestive) heart failure: Secondary | ICD-10-CM | POA: Diagnosis not present

## 2023-08-21 DIAGNOSIS — I252 Old myocardial infarction: Secondary | ICD-10-CM | POA: Insufficient documentation

## 2023-08-21 MED ORDER — EZETIMIBE 10 MG PO TABS: 10.0000 mg | ORAL_TABLET | Freq: Every day | ORAL | 3 refills | Status: DC

## 2023-08-21 MED ORDER — LOSARTAN POTASSIUM 100 MG PO TABS: 50.0000 mg | ORAL_TABLET | Freq: Every evening | ORAL | 3 refills | Status: DC

## 2023-08-21 MED ORDER — PRASUGREL HCL 10 MG PO TABS: 10.0000 mg | ORAL_TABLET | Freq: Every day | ORAL | 3 refills | Status: AC

## 2023-08-21 MED ORDER — CARVEDILOL 3.125 MG PO TABS: 3.1250 mg | ORAL_TABLET | Freq: Two times a day (BID) | ORAL | 3 refills | Status: DC

## 2023-08-21 MED ORDER — FUROSEMIDE 20 MG PO TABS: 20.0000 mg | ORAL_TABLET | Freq: Every day | ORAL | 3 refills | Status: DC

## 2023-08-21 MED ORDER — ATORVASTATIN CALCIUM 80 MG PO TABS: 80.0000 mg | ORAL_TABLET | Freq: Every day | ORAL | 3 refills | Status: AC

## 2023-08-21 NOTE — Telephone Encounter (Signed)
 Second attempt to reach out to pt. No contact and no response. Awaiting pt. To reach out to office for update

## 2023-08-21 NOTE — Patient Instructions (Signed)
 Medication Instructions:  Your physician recommends the following medication changes.  STOP TAKING: Potassium  CHANGE: Atorvastatin  and Zetia  to pm  DECREASE: Losartan  to 50 mg at night   *If you need a refill on your cardiac medications before your next appointment, please call your pharmacy*  Lab Work: No labs ordered today  If you have labs (blood work) drawn today and your tests are completely normal, you will receive your results only by: MyChart Message (if you have MyChart) OR A paper copy in the mail If you have any lab test that is abnormal or we need to change your treatment, we will call you to review the results.  Testing/Procedures: No test ordered today   Follow-Up: At [facility], you and your health needs are our priority.  As part of our continuing mission to provide you with exceptional heart care, our providers are all part of one team.  This team includes your primary Cardiologist (physician) and Advanced Practice Providers or APPs (Physician Assistants and Nurse Practitioners) who all work together to provide you with the care you need, when you need it.  Your next appointment:   3 month(s)  Provider:   Lonni Hanson, MD or Barnie Hila, NP

## 2023-08-21 NOTE — Telephone Encounter (Signed)
 Copied from CRM [id number]. Topic: Clinical - Medication Question >> Aug 21, 2023 12:16 PM [name] wrote: Reason for CRM: [name] is calling to follow up with [name] calling to confirm that the patient is on Insulin  Glargine (BASAGLAR  KWIKPEN) 100 UNIT/ML [[id number]] . DISCONTINUED. Patient is not on ozempicAppointment scheduled with pharmacy today 2:00. Appointment will be made with Endo on Monday. Requesting refill Basaglar  to be sent [id number] [place], [place] - [address] [place] [address] Phone: [phone] Fax: [phone] Hours: Not open 24 hours

## 2023-08-21 NOTE — Telephone Encounter (Signed)
 Sent my  chart messages to pt. For follow up and response awaiting message back.

## 2023-08-21 NOTE — Progress Notes (Signed)
 Cardiology Clinic Note   Date: 08/21/2023 ID: Jeremiah Neal, DOB 29-Oct-1970, MRN [id number]  Primary Cardiologist:  Lonni Hanson, MD  Chief Complaint   Jeremiah Neal is a 53 y.o. male who presents to the clinic today for hospital follow up.   Patient Profile   Jeremiah Neal is followed by Dr. Hanson for the history outlined below.      Past medical history significant for: CAD. LHC 08/03/2023 (NSTEMI): Proximal LCx 20%.  Mid LCx 99%.  Mid LAD up to 50%.  PCI with overlapping DES 3.0 x 8 mm and 2.75 x 15 mm to mid LCx Chronic HFmrEF. Echo 08/03/2023: EF 45 to 50%.  Severe LVH.  Indeterminate diastolic parameters.  Moderate hypokinesis of the left ventricular, basal-mid inferolateral wall.  Normal RV size/function.  Mild MR. Hypertension. Hyperlipidemia. Lipid panel 08/03/2023: LDL 73, HDL 31, TG 146, total 133. LPa 08/04/2023: 103.5. T2DM. Tobacco abuse. Polysubstance abuse.  In summary, patient presented to the ED on 08/03/2023 with chest pain that woke him up around 3 AM.  He initially thought it was indigestion and tried eating mustard and an effort to induce belching.  Chest pain did not improve and actually worsened beginning to radiate to the left side and up to his neck and jaw with associated shortness of breath and diaphoresis.  Pain intensified to 10 out of 10 prompting him to call EMS.  He received aspirin  and NTG with minimal improvement of pain.  In the ED pain improved with morphine , ketorolac  and IV NTG.  He reported having similar pain a few weeks prior that resolved after about 30 minutes following Alka-Seltzer.  He endorses using cocaine 3 days prior.  Troponin peak 73.  He underwent PCI with overlapping DES to mid LCx.  Echo demonstrated mildly reduced LV function.  Patient was seen by advanced heart failure clinic on 08/06/2023.  He was mildly fluid up.  Lasix  was increased.     History of Present Illness    Today, patient is accompanied by his girlfriend. He reports  one episode of chest pain one week after hospital discharge that was less intense than previous and resolved after about 30 minutes with NTG x 1. Upon medication review, patient has been taking Lasix  20 mg daily not 40 mg. He was started on spironolactone  after contacting the office regarding elevated BP. He is having a hard time swallowing potassium. He is compliant with all medications, however an hour after taking morning medications he begins to feel bad with episodes of dizziness into the evening. He reports dyspnea with minimal exertion but feel this is slowly improving. He admits the first time he noticed feeling short of breath was 2 weeks prior to hospitalization when he was carrying a cooler with a friend and had to stop and rest. He has been sedentary secondary to surgeries on his arm. He denies lower extremity edema, abdominal fullness/bloating, orthopnea or PND. He mentions that he has been sleeping very poorly only getting a couple of hours at a time since discharge from hospital. Encouraged patient to contact PCP regarding this.  He is interested in cardiac rehab.     ROS: All other systems reviewed and are otherwise negative except as noted in History of Present Illness.  EKGs/Labs Reviewed    EKG Interpretation Date/Time:  Friday August 21 2023 11:15:42 EDT Ventricular Rate:  92 PR Interval:  176 QRS Duration:  92 QT Interval:  358 QTC Calculation: 442 R Axis:   44  Text Interpretation: Normal sinus rhythm Nonspecific T wave abnormalities Cannot rule out Inferior infarct , age undetermined Cannot rule out Anterior infarct (cited on or before 03-Aug-2023) When compared with ECG of 04-Aug-2023 06:14, Questionable change in initial forces of Septal leads Nonspecific T wave abnormality now evident in Lateral leads Confirmed by Loistine Sober [phone]) on 08/21/2023 11:19:58 AM   08/12/2023: ALT 29; AST 19; BUN 24; Creat 1.16; Potassium 4.9; Sodium 138   08/12/2023: Hemoglobin 15.6;  WBC 9.2    Physical Exam    VS:  BP 132/88 (BP Location: Left Arm, Patient Position: Sitting, Cuff Size: Large)   Pulse 92   Ht 5' 10 (1.778 m)   Wt 250 lb (113.4 kg)   SpO2 95%   BMI 35.87 kg/m  , BMI Body mass index is 35.87 kg/m.  GEN: Well nourished, well developed, in no acute distress. Neck: No JVD or carotid bruits. Cardiac:  RRR.  No murmur. No rubs or gallops.   Respiratory:  Respirations regular and unlabored. Clear to auscultation without rales, wheezing or rhonchi. GI: Soft, nontender, nondistended. Extremities: Radials/DP/PT 2+ and equal bilaterally. No clubbing or cyanosis. No edema  Skin: Warm and dry, no rash. Neuro: Strength intact.  Assessment & Plan   CAD S/p PCI with overlapping DES to mid LCx 08/03/2023.  Patient reports one episode of chest pain 1 week after hospital discharge that was less intense and resolved after 30 minutes of taking NTG x 1. None since. Right wrist cath site is well healed with no edema, erythema, ecchymosis, drainage, or tenderness. 2+ radial pulse. Patient is interested in cardiac rehab.  - Continue aspirin , Effient , carvedilol , atorvastatin , Zetia , as needed SL NTG.  Chronic HFmrEF Echo 08/03/2023 showed EF 45 to 50%, severe LVH, normal RV size/function, mild MR.  Patient reports dyspnea with minimal exertion is slowly improving. He denies lower extremity edema, abdominal distention/bloating, orthopnea or PND. He is not weighing consistently at home. Stressed the importance of daily weights. Normal weight for him is 240-242 lb.  Euvolemic and well compensated on exam. He may be able to start Lasix  as needed. He sees AHF clinic in 4 days. He will continue daily Lasix  for now and discuss prn doing with them.  - Stop potassium supplement.  - Weigh daily. Bring log to follow-up visits.  - Continue carvedilol , losartan , Lasix , spironolactone .  Hypertension BP today 132/88. Patient reports feeling dizzy about an hour after taking morning  medications.  - Decrease losartan  to 50 mg daily and continuing taking in the evening.  - Continue carvedilol , spironolactone .  Hyperlipidemia LDL 73, LPa 103.18 July 2023, not at goal. - Continue atorvastatin  and Zetia  (change to taking in the evening).   Tobacco abuse Patient has only smoked 1-3 cigarettes a day since hospital discharge.  - Encouraged complete cessation.   Polysubstance use Patient reports occasionally smoking marijuana. He has not used cocaine since hospital discharge.  - Stressed the importance of abstaining from substance use.   Disposition: Decrease losartan  to 50 mg nightly. Stop potassium. Keep follow up with AHF clinic. Return in 3 months or sooner as needed.     Cardiac Rehabilitation Eligibility Assessment  The patient is ready to start cardiac rehabilitation from a cardiac standpoint.        Signed, Sober HERO. Tarrin Lebow, DNP, NP-C

## 2023-08-21 NOTE — Progress Notes (Signed)
 08/24/2023 Name: Jeremiah Neal MRN: [id number] DOB: 1970-07-02  Chief Complaint  Patient presents with   Medication Management   Diabetes    TREV Neal is a 53 y.o. year old male who was referred for medication management by their primary care provider, Kayla Jeoffrey RAMAN, FNP. They presented for a face to face visit today.   They were referred to the pharmacist by their PCP for assistance in managing diabetes and medication access    Subjective:  Care Team: Primary Care Provider: Kayla Jeoffrey RAMAN, FNP ; Next Scheduled Visit:  Future Appointments  Date Time Provider Department Center  08/25/2023  1:30 PM Donette Ellouise LABOR, FNP [facility] None  09/15/2023  3:15 PM Kayla Jeoffrey RAMAN, FNP [facility]  09/18/2023  2:00 PM Pandora Cadet, [facility] None  10/09/2023 10:00 AM [facility]  10/13/2023  4:15 PM Kayla Jeoffrey RAMAN, FNP [facility]  11/25/2023  4:20 PM End, Lonni, MD [facility] None    Medication Access/Adherence  Current Pharmacy:  [id number] [place], [place] - [address] [facility] [address] [place] [address] Phone: [phone] Fax: [phone]   Patient reports affordability concerns with their medications: No  Patient reports access/transportation concerns to their pharmacy: No  Patient reports adherence concerns with their medications:  No  managed medicaid plan   Diabetes:  Current medications: metformin  IR 1000mg  BID, lantus  65 units daily  Ozempic  never started (d/t heart attack 07/2023), levemir  had been prescribed recently but insurance did not cover. Cardiology is considering SGLT2i in the future. Referral pending for endocrinology.    Current glucose readings: random readings without regard to meals - 279, 181, 180, 186, 172.  Using glucose meter; testing 1+ times daily  Would be willing to start on CGM (wife using G7 and also has experience with Freestyle libre CGM) and would be  comfortable trying and seeing if it helps him better manage his sugars.    Patient denies hypoglycemic s/sx including dizziness, shakiness, sweating. Patient denies hyperglycemic symptoms including polyuria, polydipsia, polyphagia, nocturia, neuropathy, blurred vision.  Current medication access support: manage medicaid    Objective:  Lab Results  Component Value Date   HGBA1C 9.9 (H) 07/13/2023    Lab Results  Component Value Date   CREATININE 1.16 08/12/2023   BUN 24 08/12/2023   NA 138 08/12/2023   K 4.9 08/12/2023   CL 97 (L) 08/12/2023   CO2 31 08/12/2023    Lab Results  Component Value Date   CHOL 133 08/03/2023   HDL 31 (L) 08/03/2023   LDLCALC 73 08/03/2023   TRIG 146 08/03/2023   CHOLHDL 4.3 08/03/2023    Medications Reviewed Today     Reviewed by Pandora Cadet, [facility] (Pharmacist) on 08/21/23 at 1458  Med List Status: <None>   Medication Order Taking? Sig Documenting Provider Last Dose Status Informant  acetaminophen  (TYLENOL ) 500 MG tablet [id number]  Take 500 mg by mouth every 6 (six) hours as needed for mild pain. [provider]  Active Self, Spouse/Significant Other  aspirin  EC 81 MG tablet [id number]  Take 1 tablet (81 mg total) by mouth daily. Swallow whole. Patel, Sona, MD  Active   atorvastatin  (LIPITOR ) 80 MG tablet [id number]  Take 1 tablet (80 mg total) by mouth daily. Loistine Sober, NP  Active   BD PEN NEEDLE SHORT ULTRAFINE 31G X 8 MM MISC [id number]  USE AS DIRECTED Kayla Jeoffrey RAMAN, FNP  Active  carvedilol  (COREG ) 3.125 MG tablet [id number]  Take 1 tablet (3.125 mg total) by mouth 2 (two) times daily with a meal. Wittenborn, Deborah, NP  Active   ezetimibe  (ZETIA ) 10 MG tablet [id number]  Take 1 tablet (10 mg total) by mouth daily. Loistine Sober, NP  Active   furosemide  (LASIX ) 20 MG tablet [id number]  Take 1 tablet (20 mg total) by mouth daily. Loistine Sober, NP  Active   gabapentin  (NEURONTIN ) 100 MG capsule [id number]  Take 1-2  capsules (100-200 mg total) by mouth 3 (three) times daily. Kayla Gauze S, FNP  Expired 08/12/23 2359   insulin  detemir (LEVEMIR  FLEXTOUCH) 100 UNIT/ML FlexPen [id number]  Inject 65 Units into the skin daily.  Patient not taking: Reported on 08/21/2023   Kayla Gauze RAMAN, FNP  Active   Insulin  Glargine (BASAGLAR  [facility]) 100 UNIT/ML [id number] Yes Inject 100 Units into the skin daily. [provider]  Active   losartan  (COZAAR ) 100 MG tablet [id number] Yes Take 0.5 tablets (50 mg total) by mouth every evening. Loistine Sober, NP  Active   meloxicam (MOBIC) 15 MG tablet [id number]  Take 15 mg by mouth daily. [provider]  Active   metFORMIN  (GLUCOPHAGE ) 1000 MG tablet [id number]  Take 1 tablet (1,000 mg total) by mouth 2 (two) times daily with a meal. Kayla Gauze RAMAN, FNP  Active   nitroGLYCERIN  (NITROSTAT ) 0.4 MG SL tablet [id number]  Place 1 tablet (0.4 mg total) under the tongue every 5 (five) minutes x 3 doses as needed for chest pain. Patel, Sona, MD  Active   potassium chloride  SA (KLOR-CON  M) 20 MEQ tablet [id number]  Take 1 tablet (20 mEq total) by mouth daily. [place] A, FNP  Active   prasugrel  (EFFIENT ) 10 MG TABS tablet [id number]  Take 1 tablet (10 mg total) by mouth daily. Loistine Sober, NP  Active   Semaglutide ,0.25 or 0.5MG /DOS, (OZEMPIC , 0.25 OR 0.5 MG/DOSE,) 2 MG/1.5ML SOPN [id number]  Inject 0.25 mg into the skin once a week. Start with 0.25MG  once a week x 4 weeks, then increase to 0.5MG  weekly.  Patient not taking: Reported on 08/12/2023   Kayla Gauze RAMAN, FNP  Active   spironolactone  (ALDACTONE ) 25 MG tablet [id number]  Take 1 tablet (25 mg total) by mouth daily. Lee, [place], NP  Active               Assessment/Plan:   Diabetes: - Currently uncontrolled - Reviewed long term cardiovascular and renal outcomes of uncontrolled blood sugar - Reviewed goal A1c, goal fasting, and goal 2 hour post prandial glucose - Recommend to check glucose levels  with CGM continuously   - Patient denies personal or family history of multiple endocrine neoplasia type 2, medullary thyroid cancer; personal history of pancreatitis or gallbladder disease. - Patient to schedule with endocrinology in light of previous referral - counseled on appropriate use of loop diuretics - reviewed possible transition to [facility] out patient pharmacy - mail order and pill packaging - should they wish to transition phone number is [phone] if wanting to initiate services  - pharmacist to pend order for G7 sensors to PCP, would also be ok with libre CGM. Wife has experience with each.    Follow Up Plan: 1 month telephone f/u    Lang Sieve, PharmD, BCGP Clinical Pharmacist  [phone]

## 2023-08-24 ENCOUNTER — Other Ambulatory Visit: Payer: Self-pay | Admitting: Family Medicine

## 2023-08-24 ENCOUNTER — Telehealth: Payer: Self-pay | Admitting: Family

## 2023-08-24 MED ORDER — BASAGLAR KWIKPEN 100 UNIT/ML ~~LOC~~ SOPN: 65.0000 [IU] | PEN_INJECTOR | Freq: Every day | SUBCUTANEOUS | 2 refills | Status: DC

## 2023-08-24 MED ORDER — DEXCOM G7 SENSOR MISC: 9.0000 | 1 refills | Status: DC

## 2023-08-24 NOTE — Progress Notes (Signed)
 Advanced [facility] Failure Clinic Note   Referring Physician: admission 07/25 PCP: Kayla Jeoffrey RAMAN, FNP Cardiologist: Lonni Hanson, MD   Chief Complaint: shortness of breath   HPI:  Jeremiah Neal is a 53 y/o male with a history of HTN, HLD, NSTEMI (07/25), DM, substance use and HFrEF.   Admitted 08/03/23 with chest pain left-sided radiating to the jaw and becoming diaphoretic on and off since 3 o'clock this morning which woke him up. Cardiology consultation done.  Initially started on IV heparin  drip and nitroglycerin  drip. He underwent emergent cardiac cath 08/03/23 and had stent placement in the mid left circumflex. Echo 08/03/23: EF 45-50%, severe LVH, moderate hypokinesis, normal RV, mild Jeremiah. 1 dose of IV lasix  given and then placed on oral diuretics.   Seen in [facility] 07/25 where furosemide  was increased to 40mg  daily. 20meq potassium was started after lab results obtained.   He presents today for a HF follow-up visit with a chief complaint of shortness of breath. Has associated fatigue, palpitations. Dizziness in morning but has improved since medications adjusted. Not sleeping very well which he says is his biggest concern. + snoring. Says that he sometimes wakes up feeling rested and other times, he feels just as tired as when he went to sleep. Denies chest pain, pedal edema.   Smoking on occasion, rare alcohol use, marijuana sometimes. Denies cocaine  ROS: All systems negative except what is listed in HPI, PMH and Problem List  Past Medical History:  Diagnosis Date   Diabetes mellitus without complication (HCC)    type 2   Diverticulitis    Hyperlipidemia    Hypertension     Current Outpatient Medications  Medication Sig Dispense Refill   acetaminophen  (TYLENOL ) 500 MG tablet Take 500 mg by mouth every 6 (six) hours as needed for mild pain.     aspirin  EC 81 MG tablet Take 1 tablet (81 mg total) by mouth daily. Swallow whole. 30 tablet 12   atorvastatin  (LIPITOR ) 80 MG tablet Take 1  tablet (80 mg total) by mouth daily. 90 tablet 3   BD PEN NEEDLE SHORT ULTRAFINE 31G X 8 MM MISC USE AS DIRECTED 100 each 1   carvedilol  (COREG ) 3.125 MG tablet Take 1 tablet (3.125 mg total) by mouth 2 (two) times daily with a meal. 180 tablet 3   Continuous Glucose Sensor (DEXCOM G7 SENSOR) MISC 9 each by Does not apply route as directed. Apply new sensor once every 10 days 9 each 1   ezetimibe  (ZETIA ) 10 MG tablet Take 1 tablet (10 mg total) by mouth daily. 90 tablet 3   furosemide  (LASIX ) 20 MG tablet Take 1 tablet (20 mg total) by mouth daily. 90 tablet 3   gabapentin  (NEURONTIN ) 100 MG capsule Take 1-2 capsules (100-200 mg total) by mouth 3 (three) times daily. 180 capsule 2   Insulin  Glargine (BASAGLAR  KWIKPEN) 100 UNIT/ML Inject 65 Units into the skin daily. 15 mL 2   losartan  (COZAAR ) 100 MG tablet Take 0.5 tablets (50 mg total) by mouth every evening. 90 tablet 3   meloxicam (MOBIC) 15 MG tablet Take 15 mg by mouth daily.     metFORMIN  (GLUCOPHAGE ) 1000 MG tablet Take 1 tablet (1,000 mg total) by mouth 2 (two) times daily with a meal. 180 tablet 1   nitroGLYCERIN  (NITROSTAT ) 0.4 MG SL tablet Place 1 tablet (0.4 mg total) under the tongue every 5 (five) minutes x 3 doses as needed for chest pain. 25 tablet 0   potassium chloride   SA (KLOR-CON  M) 20 MEQ tablet Take 1 tablet (20 mEq total) by mouth daily. 90 tablet 3   prasugrel  (EFFIENT ) 10 MG TABS tablet Take 1 tablet (10 mg total) by mouth daily. 90 tablet 3   Semaglutide ,0.25 or 0.5MG /DOS, (OZEMPIC , 0.25 OR 0.5 MG/DOSE,) 2 MG/1.5ML SOPN Inject 0.25 mg into the skin once a week. Start with 0.25MG  once a week x 4 weeks, then increase to 0.5MG  weekly. (Patient not taking: Reported on 08/12/2023) 3 mL 0   spironolactone  (ALDACTONE ) 25 MG tablet Take 1 tablet (25 mg total) by mouth daily. 90 tablet 3   No current facility-administered medications for this visit.    Allergies  Allergen Reactions   Penicillins Nausea Only    Has patient had  a PCN reaction causing immediate rash, facial/tongue/throat swelling, SOB or lightheadedness with hypotension: No Has patient had a PCN reaction causing severe rash involving mucus membranes or skin necrosis: No Has patient had a PCN reaction that required hospitalization: No Has patient had a PCN reaction occurring within the last 10 years: Yes If all of the above answers are NO, then may proceed with Cephalosporin use.    Penicillin G Rash   Penicillin G Procaine Rash      Social History   Socioeconomic History   Marital status: Single    Spouse name: Not on file   Number of children: Not on file   Years of education: Not on file   Highest education level: 12th grade  Occupational History   Not on file  Tobacco Use   Smoking status: Some Days    Current packs/day: 0.25    Average packs/day: 0.3 packs/day for 0.6 years (0.2 ttl pk-yrs)    Types: Cigarettes    Start date: 2025   Smokeless tobacco: Never  Vaping Use   Vaping status: Never Used  Substance and Sexual Activity   Alcohol use: Yes    Comment: Fifth of liquor per week, usually on Fridays   Drug use: Yes    Types: Marijuana, Cocaine   Sexual activity: Yes  Other Topics Concern   Not on file  Social History Narrative   ** Merged History Encounter **       Social Drivers of Health   Financial Resource Strain: Patient Declined (08/09/2023)   Overall Financial Resource Strain (CARDIA)    Difficulty of Paying Living Expenses: Patient declined  Recent Concern: Physicist, medical Strain - High Risk (08/06/2023)   Overall Financial Resource Strain (CARDIA)    Difficulty of Paying Living Expenses: Very hard  Food Insecurity: Patient Declined (08/09/2023)   Hunger Vital Sign    Worried About Running Out of Food in the Last Year: Patient declined    Ran Out of Food in the Last Year: Patient declined  Recent Concern: Food Insecurity - Food Insecurity Present (08/06/2023)   Hunger Vital Sign    Worried About Running  Out of Food in the Last Year: Sometimes true    Ran Out of Food in the Last Year: Sometimes true  Transportation Needs: Unmet Transportation Needs (08/09/2023)   PRAPARE - Transportation    Lack of Transportation (Medical): Yes    Lack of Transportation (Non-Medical): Patient declined  Physical Activity: Inactive (08/09/2023)   Exercise Vital Sign    Days of Exercise per Week: 0 days    Minutes of Exercise per Session: Not on file  Stress: No Stress Concern Present (08/09/2023)   [employer] of Occupational Health - Occupational Stress Questionnaire  Feeling of Stress: Only a little  Social Connections: Moderately Isolated (08/09/2023)   Social Connection and Isolation Panel    Frequency of Communication with Friends and Family: Twice a week    Frequency of Social Gatherings with Friends and Family: Once a week    Attends Religious Services: Never    Database administrator or Organizations: No    Attends Engineer, structural: Not on file    Marital Status: Living with partner  Intimate Partner Violence: Patient Declined (08/03/2023)   Humiliation, Afraid, Rape, and Kick questionnaire    Fear of Current or Ex-Partner: Patient declined    Emotionally Abused: Patient declined    Physically Abused: Patient declined    Sexually Abused: Patient declined      Family History  Problem Relation Age of Onset   Heart attack Father    Vitals:   08/25/23 1330  BP: 127/89  Pulse: 90  SpO2: 95%  Weight: 250 lb 8 oz (113.6 kg)   Wt Readings from Last 3 Encounters:  08/25/23 250 lb 8 oz (113.6 kg)  08/21/23 250 lb (113.4 kg)  08/12/23 248 lb 9.6 oz (112.8 kg)   Lab Results  Component Value Date   CREATININE 1.16 08/12/2023   CREATININE 1.15 08/06/2023   CREATININE 0.95 08/04/2023    PHYSICAL EXAM:  General: Well appearing.  Cor: No JVD. Regular rhythm, rate.  Lungs: clear Abdomen: soft, nontender, nondistended. Extremities: no edema Neuro:. Affect  pleasant   ECG: not done   ASSESSMENT & PLAN:  1: ICM with reduced ejection fraction- - cath 08/03/23: subtotally occluded mid LCx successfully treated with 2 overlapping DES  - NYHA Neal II - weight up 4 pounds from last visit here 3 weeks ago. He says that when he has trouble sleeping, he will eat.  - Echo 08/03/23: EF 45-50%, severe LVH, moderate hypokinesis, normal RV, mild Jeremiah - continue carvedilol  3.125mg  BID - change furosemide  to 20mg  PRN for above weight gain or worsening symptoms - continue losartan  50mg  daily. Was experiencing dizziness with the 100mg  dose - no longer taking potassium tablets - continue spironolactone  25mg  daily - Discussed potential benefits of SGLT2 and he declines starting it at this time. Says that he really doesn't want to take any more medications - reviewed keeping daily fluid intake to 60-64 ounces  - Discussed doing a sleep study due to snoring, fatigue and he declines that at this time as well.   2: NSTEMI- - cath 08/03/23: subtotally occluded mid LCx successfully treated with 2 overlapping DES  - saw cardiology ([place]) 08/25 - continue aspirin  and prasugrel  for at least 12 months (07/26) - Cardiac rehab referral was already placed during recent admission.   3: HTN- - BP 118/73 - saw PCP [place]) 07/25 - BMET 08/12/23 reviewed: sodium 138, potassium 4.9, creatinine 1.16 and GFR 75  4: DM (managed by PCP)- - A1c 07/13/23 was 9.9% - continue metformin , insulin   5: Hyperlipidemia- - LDL 08/03/23 was 73 - lipo (a) 08/04/23 was 103.5 - continue atorvastatin  80mg  daily - continue zetia  10mg  daily  6: Substance use- - occasional tobacco - marijuana use sometimes   Return in 5 months, sooner if needed.   Jeremiah DELENA Class, FNP 08/24/23

## 2023-08-24 NOTE — Telephone Encounter (Signed)
 Called to confirm/remind patient of their appointment at the [facility] on 08/25/23.   Appointment:   [] Confirmed  [x] Left mess   [] No answer/No voice mail  [] VM Full/unable to leave message  [] Phone not in service  Patient reminded to bring all medications and/or complete list.  Confirmed patient has transportation. Gave directions, instructed to utilize valet parking.

## 2023-08-25 ENCOUNTER — Ambulatory Visit: Attending: Family | Admitting: Family

## 2023-08-25 ENCOUNTER — Telehealth: Payer: Self-pay

## 2023-08-25 ENCOUNTER — Encounter: Payer: Self-pay | Admitting: Family

## 2023-08-25 VITALS — BP 127/89 | HR 90 | Wt 250.5 lb

## 2023-08-25 DIAGNOSIS — Z794 Long term (current) use of insulin: Secondary | ICD-10-CM | POA: Diagnosis not present

## 2023-08-25 DIAGNOSIS — R0683 Snoring: Secondary | ICD-10-CM | POA: Diagnosis not present

## 2023-08-25 DIAGNOSIS — I252 Old myocardial infarction: Secondary | ICD-10-CM | POA: Insufficient documentation

## 2023-08-25 DIAGNOSIS — Z8249 Family history of ischemic heart disease and other diseases of the circulatory system: Secondary | ICD-10-CM | POA: Diagnosis not present

## 2023-08-25 DIAGNOSIS — F1910 Other psychoactive substance abuse, uncomplicated: Secondary | ICD-10-CM

## 2023-08-25 DIAGNOSIS — E782 Mixed hyperlipidemia: Secondary | ICD-10-CM | POA: Diagnosis not present

## 2023-08-25 DIAGNOSIS — Z7902 Long term (current) use of antithrombotics/antiplatelets: Secondary | ICD-10-CM | POA: Insufficient documentation

## 2023-08-25 DIAGNOSIS — F17210 Nicotine dependence, cigarettes, uncomplicated: Secondary | ICD-10-CM | POA: Insufficient documentation

## 2023-08-25 DIAGNOSIS — Z7985 Long-term (current) use of injectable non-insulin antidiabetic drugs: Secondary | ICD-10-CM | POA: Insufficient documentation

## 2023-08-25 DIAGNOSIS — I110 Hypertensive heart disease with heart failure: Secondary | ICD-10-CM | POA: Insufficient documentation

## 2023-08-25 DIAGNOSIS — Z7984 Long term (current) use of oral hypoglycemic drugs: Secondary | ICD-10-CM | POA: Insufficient documentation

## 2023-08-25 DIAGNOSIS — Z5986 Financial insecurity: Secondary | ICD-10-CM | POA: Diagnosis not present

## 2023-08-25 DIAGNOSIS — Z79899 Other long term (current) drug therapy: Secondary | ICD-10-CM | POA: Diagnosis not present

## 2023-08-25 DIAGNOSIS — Z5982 Transportation insecurity: Secondary | ICD-10-CM | POA: Diagnosis not present

## 2023-08-25 DIAGNOSIS — E785 Hyperlipidemia, unspecified: Secondary | ICD-10-CM | POA: Insufficient documentation

## 2023-08-25 DIAGNOSIS — I255 Ischemic cardiomyopathy: Secondary | ICD-10-CM | POA: Diagnosis not present

## 2023-08-25 DIAGNOSIS — I5022 Chronic systolic (congestive) heart failure: Secondary | ICD-10-CM | POA: Diagnosis not present

## 2023-08-25 DIAGNOSIS — E119 Type 2 diabetes mellitus without complications: Secondary | ICD-10-CM | POA: Insufficient documentation

## 2023-08-25 DIAGNOSIS — I10 Essential (primary) hypertension: Secondary | ICD-10-CM | POA: Diagnosis not present

## 2023-08-25 DIAGNOSIS — Z419 Encounter for procedure for purposes other than remedying health state, unspecified: Secondary | ICD-10-CM | POA: Diagnosis not present

## 2023-08-25 MED ORDER — FUROSEMIDE 20 MG PO TABS: 20.0000 mg | ORAL_TABLET | ORAL | Status: DC | PRN

## 2023-08-25 NOTE — Telephone Encounter (Signed)
 Copied from CRM [id number]. Topic: Clinical - Medication Question >> Aug 21, 2023 12:16 PM [name] wrote: Reason for CRM: [name] is calling to follow up with Amber calling to confirm that the patient is on Insulin  Glargine (BASAGLAR  KWIKPEN) 100 UNIT/ML [[id number]] . DISCONTINUED. Patient is not on ozempicAppointment scheduled with pharmacy today 2:00. Appointment will be made with Endo on Monday. Requesting refill Basaglar  to be sent [id number] [place], [place] - [address] [place] [address] Phone: [phone] Fax: [phone] Hours: Not open 24 hours >> Aug 25, 2023 11:35 AM Emylou G wrote: Patient called.. said he is using 65 units per day - pls call him if you need anything else. >> Aug 21, 2023  2:23 PM Delon B wrote: Telephone encounter routed to provider's CMA.

## 2023-08-25 NOTE — Patient Instructions (Signed)
 Medication Changes:  Take your Lasix  as need for swelling or weight gain of (3 lbs or more overnight or 5 lbs or more in a week)   Follow-Up: in [place] with Ellouise Class, FNP.   Thank you for choosing [facility].    At the Advanced Heart Failure Clinic, you and your health needs are our priority. We have a designated team specialized in the treatment of Heart Failure. This Care Team includes your primary Heart Failure Specialized Cardiologist (physician), Advanced Practice Providers (APPs- Physician Assistants and Nurse Practitioners), and Pharmacist who all work together to provide you with the care you need, when you need it.   You may see any of the following providers on your designated Care Team at your next follow up:  Dr. Toribio Fuel Dr. Ezra Shuck Dr. Ria Commander Dr. Morene Brownie Ellouise Class, FNP Jaun Bash, RPH-CPP  Please be sure to bring in all your medications bottles to every appointment.   Need to Contact Us :  If you have any questions or concerns before your next appointment please send us  a message through [place] or call our office at [phone].    TO LEAVE A MESSAGE FOR THE NURSE SELECT OPTION 2, PLEASE LEAVE A MESSAGE INCLUDING: YOUR NAME DATE OF BIRTH CALL BACK NUMBER REASON FOR CALL**this is important as we prioritize the call backs  YOU WILL RECEIVE A CALL BACK THE SAME DAY AS LONG AS YOU CALL BEFORE 4:00 PM

## 2023-09-05 ENCOUNTER — Other Ambulatory Visit: Payer: Self-pay | Admitting: Family Medicine

## 2023-09-07 ENCOUNTER — Telehealth: Payer: Self-pay | Admitting: Internal Medicine

## 2023-09-07 MED ORDER — NITROGLYCERIN 0.4 MG SL SUBL: 0.4000 mg | SUBLINGUAL_TABLET | SUBLINGUAL | 3 refills | Status: AC | PRN

## 2023-09-07 NOTE — Telephone Encounter (Signed)
*  STAT* If patient is at the pharmacy, call can be transferred to refill team.   1. Which medications need to be refilled? (please list name of each medication and dose if known)   nitroGLYCERIN  (NITROSTAT ) 0.4 MG SL tablet    4. Which pharmacy/location (including street and city if local pharmacy) is medication to be sent to?  [id number] [place], [place] - [address] [facility] [address] Phone: [phone]  Fax: [phone]       5. Do they need a 30 day or 90 day supply? 90  Per pt significant other, pt misplaced the pills.

## 2023-09-07 NOTE — Telephone Encounter (Signed)
 RX sent in

## 2023-09-15 ENCOUNTER — Ambulatory Visit: Admitting: Family Medicine

## 2023-09-17 DIAGNOSIS — M542 Cervicalgia: Secondary | ICD-10-CM | POA: Diagnosis not present

## 2023-09-18 ENCOUNTER — Other Ambulatory Visit: Payer: Self-pay

## 2023-09-18 NOTE — Progress Notes (Signed)
 09/18/2023 Name: Jeremiah Neal MRN: [id number] DOB: 20-Dec-1970  No chief complaint on file.   Jeremiah Neal is a 53 y.o. year old male who was referred for medication management by their primary care provider, Kayla Jeoffrey RAMAN, FNP. They presented for a face to face visit today.   They were referred to the pharmacist by their PCP for assistance in managing diabetes and medication access    Subjective:  Care Team: Primary Care Provider: Kayla Jeoffrey RAMAN, FNP ; Next Scheduled Visit:  Future Appointments  Date Time Provider Department Center  09/18/2023  2:00 PM Pandora Cadet, [facility] None  09/21/2023  4:15 PM Kayla Jeoffrey RAMAN, FNP [facility]  10/09/2023 10:00 AM [facility]  10/13/2023  4:15 PM Kayla Jeoffrey RAMAN, FNP [facility]  11/25/2023  4:20 PM End, Lonni, MD [facility] None  01/26/2024  1:30 PM Donette Ellouise LABOR, FNP [facility] None    Medication Access/Adherence  Current Pharmacy:  [id number] [place], [place] - [address] [facility] [address] [place] [address] Phone: [phone] Fax: [phone]   Patient reports affordability concerns with their medications: No  Patient reports access/transportation concerns to their pharmacy: No  Patient reports adherence concerns with their medications:  No  managed medicaid plan   Diabetes:  Current medications: metformin  IR 1000mg  BID, lantus  65 units daily  Ozempic  never started (d/t heart attack 07/2023), levemir  had been prescribed recently but insurance did not cover. Cardiology is considering SGLT2i in the future. Referral pending for endocrinology.    Current glucose readings: random readings without regard to meals - 279, 181, 180, 186, 172.  Using glucose meter; testing 1+ times daily  Would be willing to start on CGM (wife using G7 and also has experience with Freestyle libre CGM) and would be comfortable trying and seeing if it helps him  better manage his sugars.    Patient denies hypoglycemic s/sx including dizziness, shakiness, sweating. Patient denies hyperglycemic symptoms including polyuria, polydipsia, polyphagia, nocturia, neuropathy, blurred vision.  Current medication access support: manage medicaid   09/18/2023 update: - today 221 BG, yesterday 196,  - G7 - [facility]  - 65 units basaglar , takes consistently most nights but might forget if he falls asleep too early - happened past two nights - has not gotten around to scheduling with endocrinology  Future Appointments  Date Time Provider Department Center  09/21/2023  4:15 PM Kayla Jeoffrey RAMAN, FNP [facility]  10/09/2023 10:00 AM [facility]  10/13/2023  4:15 PM Kayla Jeoffrey RAMAN, FNP [facility]  11/25/2023  4:20 PM End, Lonni, MD [facility] None  01/26/2024  1:30 PM Donette Ellouise LABOR, FNP [facility] None    Objective:  Lab Results  Component Value Date   HGBA1C 9.9 (H) 07/13/2023    Lab Results  Component Value Date   CREATININE 1.16 08/12/2023   BUN 24 08/12/2023   NA 138 08/12/2023   K 4.9 08/12/2023   CL 97 (L) 08/12/2023   CO2 31 08/12/2023    Lab Results  Component Value Date   CHOL 133 08/03/2023   HDL 31 (L) 08/03/2023   LDLCALC 73 08/03/2023   TRIG 146 08/03/2023   CHOLHDL 4.3 08/03/2023    Assessment/Plan:   Diabetes: - Currently uncontrolled - Reviewed long term cardiovascular and renal outcomes of uncontrolled blood sugar - Reviewed goal A1c, goal fasting, and goal 2 hour  post prandial glucose - Recommend to check glucose levels with CGM continuously   - Patient denies personal or family history of multiple endocrine neoplasia type 2, medullary thyroid cancer; personal history of pancreatitis or gallbladder disease. - Patient to schedule with endocrinology in light of previous referral - counseled on appropriate use of loop diuretics - reviewed possible  transition to [facility] out patient pharmacy - mail order and pill packaging - should they wish to transition phone number is [phone] if wanting to initiate services  - pharmacist to pend order for G7 sensors to PCP, would also be ok with libre CGM. Wife has experience with each.    Follow Up Plan: 1 month telephone f/u    09/18/2023 update: - reviewed glucose readings and BG goals - improved but not at goal - reviewed rx for G7 - patient never received, apparently was needing a prior authorization but had not never been sent. Called pharmacy to confirm it would be sent  - provided contact information for endocrinology to [facility], encouraging them to reach out today to schedule  - agreed on f/u call next week   Lang Sieve, PharmD, BCGP Clinical Pharmacist  [phone]

## 2023-09-21 ENCOUNTER — Ambulatory Visit: Admitting: Family Medicine

## 2023-09-25 ENCOUNTER — Other Ambulatory Visit: Payer: Self-pay

## 2023-09-25 DIAGNOSIS — Z419 Encounter for procedure for purposes other than remedying health state, unspecified: Secondary | ICD-10-CM | POA: Diagnosis not present

## 2023-09-25 NOTE — Progress Notes (Signed)
   09/25/2023  Patient ID: Jeremiah Neal, male   DOB: [birth year], 53 y.o.   MRN: [id number]  Noted that patient PA for dexcom g7 had been submitted. Submitted request today 09/25/2023 Key: [id number]. Plan to outreach to patient next Friday to review.   Lang Sieve, PharmD, BCGP Clinical Pharmacist  [phone]

## 2023-09-28 ENCOUNTER — Other Ambulatory Visit (HOSPITAL_COMMUNITY): Payer: Self-pay

## 2023-09-28 DIAGNOSIS — R42 Dizziness and giddiness: Secondary | ICD-10-CM | POA: Diagnosis not present

## 2023-09-28 DIAGNOSIS — M5412 Radiculopathy, cervical region: Secondary | ICD-10-CM | POA: Diagnosis not present

## 2023-09-28 DIAGNOSIS — M7918 Myalgia, other site: Secondary | ICD-10-CM | POA: Diagnosis not present

## 2023-09-29 DIAGNOSIS — M19031 Primary osteoarthritis, right wrist: Secondary | ICD-10-CM | POA: Diagnosis not present

## 2023-09-29 DIAGNOSIS — G5603 Carpal tunnel syndrome, bilateral upper limbs: Secondary | ICD-10-CM | POA: Diagnosis not present

## 2023-09-29 DIAGNOSIS — M19032 Primary osteoarthritis, left wrist: Secondary | ICD-10-CM | POA: Diagnosis not present

## 2023-10-02 ENCOUNTER — Other Ambulatory Visit: Payer: Self-pay

## 2023-10-02 NOTE — Progress Notes (Signed)
 10/02/2023 Name: Jeremiah Neal MRN: [id number] DOB: Jul 28, 1970  Chief Complaint  Patient presents with   Diabetes    Jeremiah Neal is a 53 y.o. year old male who was referred for medication management by their primary care provider, Kayla Jeoffrey RAMAN, FNP. They presented for a face to face visit today.   They were referred to the pharmacist by their PCP for assistance in managing diabetes and medication access    Subjective:  Care Team: Primary Care Provider: Kayla Jeoffrey RAMAN, FNP ; Next Scheduled Visit:  Future Appointments  Date Time Provider Department Center  10/09/2023 10:00 AM [facility]  10/13/2023  4:15 PM Kayla Jeoffrey RAMAN, FNP [facility]  11/25/2023  4:20 PM End, Lonni, MD [facility] None  12/02/2023  2:45 PM Buck Saucer, MD [facility] None  01/26/2024  1:30 PM Donette Ellouise LABOR, FNP [facility] None    Medication Access/Adherence  Current Pharmacy:  [id number] [place], [place] - [phone] [address] [place] [address] Phone: [phone] Fax: [phone]   Patient reports affordability concerns with their medications: No  Patient reports access/transportation concerns to their pharmacy: No  Patient reports adherence concerns with their medications:  No  managed medicaid plan   Diabetes:  Current medications: metformin  IR 1000mg  BID, lantus  65 units daily  Ozempic  never started (d/t heart attack 07/2023), levemir  had been prescribed recently but insurance did not cover. Cardiology is considering SGLT2i in the future. Referral pending for endocrinology.    Current glucose readings: random readings without regard to meals - 279, 181, 180, 186, 172.  Using glucose meter; testing 1+ times daily  Would be willing to start on CGM (wife using G7 and also has experience with Freestyle libre CGM) and would be comfortable trying and seeing if it helps him better manage his sugars.     Patient denies hypoglycemic s/sx including dizziness, shakiness, sweating. Patient denies hyperglycemic symptoms including polyuria, polydipsia, polyphagia, nocturia, neuropathy, blurred vision.  Current medication access support: manage medicaid   09/18/2023 update: - today 221 BG, yesterday 196,  - G7 - [facility] not [facility]  - 65 units basaglar , takes consistently most nights but might forget if he falls asleep too early - happened past two nights - has not gotten around to scheduling with endocrinology  10/01/2023 update: - continues with tresiba, doing finger sticks, has not heard anything about dexcom G7 Spoke with patient to review recent BG. States he has been receiving steroid injections so readings have been back to >200.  - notes that they have successfully scheduled a visit with endocrinology, aware that he has labs and PCP f/u appt coming up  - pain has limited level of activity, starting to feel better and getting back to being more active after recent injections.   Future Appointments  Date Time Provider Department Center  10/09/2023 10:00 AM [facility]  10/13/2023  4:15 PM Kayla Jeoffrey RAMAN, FNP [facility]  11/25/2023  4:20 PM End, Lonni, MD [facility] None  12/02/2023  2:45 PM Buck Saucer, MD [facility] None  01/26/2024  1:30 PM Donette Ellouise LABOR, FNP [facility] None    Objective:  Lab Results  Component Value Date   HGBA1C 9.9 (H) 07/13/2023    Lab Results  Component Value Date   CREATININE 1.16 08/12/2023   BUN 24 08/12/2023   NA 138 08/12/2023   K 4.9 08/12/2023  CL 97 (L) 08/12/2023   CO2 31 08/12/2023    Lab Results  Component Value Date   CHOL 133 08/03/2023   HDL 31 (L) 08/03/2023   LDLCALC 73 08/03/2023   TRIG 146 08/03/2023   CHOLHDL 4.3 08/03/2023    Assessment/Plan:   Diabetes: - Currently uncontrolled - Reviewed long term cardiovascular and renal outcomes of uncontrolled blood  sugar - Reviewed goal A1c, goal fasting, and goal 2 hour post prandial glucose - Recommend to check glucose levels with CGM continuously   - Patient denies personal or family history of multiple endocrine neoplasia type 2, medullary thyroid cancer; personal history of pancreatitis or gallbladder disease. - Patient to schedule with endocrinology in light of previous referral - counseled on appropriate use of loop diuretics - reviewed possible transition to [facility] out patient pharmacy - mail order and pill packaging - should they wish to transition phone number is [phone] if wanting to initiate services  - pharmacist to pend order for G7 sensors to PCP, would also be ok with libre CGM. Wife has experience with each.    Follow Up Plan: 1 month telephone f/u    09/18/2023 update: - reviewed glucose readings and BG goals - improved but not at goal - reviewed rx for G7 - patient never received, apparently was needing a prior authorization but had not never been sent. Called pharmacy to confirm it would be sent  - provided contact information for endocrinology to [facility], encouraging them to reach out today to schedule  - agreed on f/u call next week   10/02/2023 update: - reviewed recent BG, appears uncontrolled given readings >200 - reviewed recent office fax - appears PA for Dexcom was denied  - contacted [employer] for PA appeal - appeal initiated, reference for appeal is  [id number] - encouraged patient to discuss ozempic  with PCP, has not taken.    Lang Sieve, PharmD, BCGP Clinical Pharmacist  [phone]

## 2023-10-05 ENCOUNTER — Other Ambulatory Visit (HOSPITAL_COMMUNITY): Payer: Self-pay

## 2023-10-05 ENCOUNTER — Telehealth (HOSPITAL_COMMUNITY): Payer: Self-pay | Admitting: Pharmacy Technician

## 2023-10-05 DIAGNOSIS — D225 Melanocytic nevi of trunk: Secondary | ICD-10-CM | POA: Diagnosis not present

## 2023-10-05 DIAGNOSIS — D2272 Melanocytic nevi of left lower limb, including hip: Secondary | ICD-10-CM | POA: Diagnosis not present

## 2023-10-05 DIAGNOSIS — L820 Inflamed seborrheic keratosis: Secondary | ICD-10-CM | POA: Diagnosis not present

## 2023-10-05 DIAGNOSIS — L570 Actinic keratosis: Secondary | ICD-10-CM | POA: Diagnosis not present

## 2023-10-05 DIAGNOSIS — L821 Other seborrheic keratosis: Secondary | ICD-10-CM | POA: Diagnosis not present

## 2023-10-05 DIAGNOSIS — Z85828 Personal history of other malignant neoplasm of skin: Secondary | ICD-10-CM | POA: Diagnosis not present

## 2023-10-05 DIAGNOSIS — L538 Other specified erythematous conditions: Secondary | ICD-10-CM | POA: Diagnosis not present

## 2023-10-05 DIAGNOSIS — D2262 Melanocytic nevi of left upper limb, including shoulder: Secondary | ICD-10-CM | POA: Diagnosis not present

## 2023-10-05 DIAGNOSIS — Z08 Encounter for follow-up examination after completed treatment for malignant neoplasm: Secondary | ICD-10-CM | POA: Diagnosis not present

## 2023-10-05 DIAGNOSIS — D2261 Melanocytic nevi of right upper limb, including shoulder: Secondary | ICD-10-CM | POA: Diagnosis not present

## 2023-10-05 DIAGNOSIS — D2271 Melanocytic nevi of right lower limb, including hip: Secondary | ICD-10-CM | POA: Diagnosis not present

## 2023-10-05 NOTE — Telephone Encounter (Signed)
 Pharmacy Patient Advocate Encounter   Received notification from CC chart msg that prior authorization for Dexcom G7 is required/requested.   Insurance verification completed.   The patient is insured through [facility] MEDICAID .   Per test claim: Ins believes pt has a primary coverage. LVM for pt to call back to discuss. Unable to do PA at this time.

## 2023-10-05 NOTE — Progress Notes (Signed)
 PA request has been Received. New Encounter has been or will be created for follow up. For additional info see Pharmacy Prior Auth telephone encounter from 10/05/23.

## 2023-10-09 ENCOUNTER — Other Ambulatory Visit

## 2023-10-09 DIAGNOSIS — E785 Hyperlipidemia, unspecified: Secondary | ICD-10-CM | POA: Diagnosis not present

## 2023-10-09 DIAGNOSIS — I152 Hypertension secondary to endocrine disorders: Secondary | ICD-10-CM | POA: Diagnosis not present

## 2023-10-09 DIAGNOSIS — E1165 Type 2 diabetes mellitus with hyperglycemia: Secondary | ICD-10-CM

## 2023-10-09 DIAGNOSIS — Z794 Long term (current) use of insulin: Secondary | ICD-10-CM | POA: Diagnosis not present

## 2023-10-09 DIAGNOSIS — E1169 Type 2 diabetes mellitus with other specified complication: Secondary | ICD-10-CM

## 2023-10-09 DIAGNOSIS — E1159 Type 2 diabetes mellitus with other circulatory complications: Secondary | ICD-10-CM | POA: Diagnosis not present

## 2023-10-13 ENCOUNTER — Ambulatory Visit: Admitting: Family Medicine

## 2023-10-14 ENCOUNTER — Ambulatory Visit: Admitting: Family Medicine

## 2023-10-14 ENCOUNTER — Ambulatory Visit: Payer: Self-pay | Admitting: Family Medicine

## 2023-10-15 ENCOUNTER — Encounter: Payer: Self-pay | Admitting: Internal Medicine

## 2023-10-15 ENCOUNTER — Ambulatory Visit: Admitting: Family Medicine

## 2023-10-15 ENCOUNTER — Encounter: Payer: Self-pay | Admitting: Family Medicine

## 2023-10-15 VITALS — BP 129/82 | HR 95 | Temp 98.1°F | Ht 70.0 in | Wt 240.0 lb

## 2023-10-15 DIAGNOSIS — Z79899 Other long term (current) drug therapy: Secondary | ICD-10-CM

## 2023-10-15 DIAGNOSIS — Z794 Long term (current) use of insulin: Secondary | ICD-10-CM | POA: Diagnosis not present

## 2023-10-15 DIAGNOSIS — E1165 Type 2 diabetes mellitus with hyperglycemia: Secondary | ICD-10-CM | POA: Diagnosis not present

## 2023-10-15 MED ORDER — BASAGLAR KWIKPEN 100 UNIT/ML ~~LOC~~ SOPN: 70.0000 [IU] | PEN_INJECTOR | Freq: Every day | SUBCUTANEOUS | 3 refills | Status: DC

## 2023-10-15 NOTE — Assessment & Plan Note (Signed)
 Placed referral to endo again as this was closed. Continue metformin  1000mg  BID. Increase Basaglar  1 unit daily until FBG <130. Follow up in 1 week.

## 2023-10-15 NOTE — Progress Notes (Signed)
 Subjective:  HPI: Jeremiah Neal is a 53 y.o. male presenting on 10/15/2023 for Medical Management of Chronic Issues (3 month f/u /Need a larger dosage of basaglar   kwik pen. The one box does not last thru the month )   HPI Patient is in today for diabetes follow up. He is currently taking metformin  1000mg  BID, basaglar  70 units daily. Does not recall what FBG is this week, previously over 200 FBG. Did receive steroid shots last week so BG was elevated. Waiting on A1c to process.  Awaiting on Dexcom G7 approval. Denies polyuria, polydypsia, chest pain, paresthesias, or chest pain. Awaiting Endo referral  Review of Systems  All other systems reviewed and are negative.   Relevant past medical history reviewed and updated as indicated.   Past Medical History:  Diagnosis Date   Diabetes mellitus without complication (HCC)    type 2   Diverticulitis    Hyperlipidemia    Hypertension      Past Surgical History:  Procedure Laterality Date   CORONARY STENT INTERVENTION N/A 08/03/2023   Procedure: CORONARY STENT INTERVENTION;  Surgeon: Mady Bruckner, MD;  Location: [facility] INVASIVE CV LAB;  Service: Cardiovascular;  Laterality: N/A;   LEFT HEART CATH AND CORONARY ANGIOGRAPHY N/A 08/03/2023   Procedure: LEFT HEART CATH AND CORONARY ANGIOGRAPHY;  Surgeon: Mady Bruckner, MD;  Location: [facility] INVASIVE CV LAB;  Service: Cardiovascular;  Laterality: N/A;    Allergies and medications reviewed and updated.   Current Outpatient Medications:    acetaminophen  (TYLENOL ) 500 MG tablet, Take 500 mg by mouth every 6 (six) hours as needed for mild pain., Disp: , Rfl:    aspirin  EC 81 MG tablet, Take 1 tablet (81 mg total) by mouth daily. Swallow whole., Disp: 30 tablet, Rfl: 12   atorvastatin  (LIPITOR ) 80 MG tablet, Take 1 tablet (80 mg total) by mouth daily., Disp: 90 tablet, Rfl: 3   BD PEN NEEDLE SHORT ULTRAFINE 31G X 8 MM MISC, USE AS DIRECTED, Disp: 100 each, Rfl: 1   carvedilol  (COREG )  3.125 MG tablet, Take 1 tablet (3.125 mg total) by mouth 2 (two) times daily with a meal., Disp: 180 tablet, Rfl: 3   ezetimibe  (ZETIA ) 10 MG tablet, Take 1 tablet (10 mg total) by mouth daily., Disp: 90 tablet, Rfl: 3   gabapentin  (NEURONTIN ) 100 MG capsule, Take 1-2 capsules (100-200 mg total) by mouth 3 (three) times daily., Disp: 180 capsule, Rfl: 2   losartan  (COZAAR ) 100 MG tablet, Take 0.5 tablets (50 mg total) by mouth every evening., Disp: 90 tablet, Rfl: 3   metFORMIN  (GLUCOPHAGE ) 1000 MG tablet, TAKE 1 TABLET (1,000 MG TOTAL) BY MOUTH TWICE A DAY WITH FOOD, Disp: 180 tablet, Rfl: 1   nitroGLYCERIN  (NITROSTAT ) 0.4 MG SL tablet, Place 1 tablet (0.4 mg total) under the tongue every 5 (five) minutes x 3 doses as needed for chest pain., Disp: 25 tablet, Rfl: 3   prasugrel  (EFFIENT ) 10 MG TABS tablet, Take 1 tablet (10 mg total) by mouth daily., Disp: 90 tablet, Rfl: 3   spironolactone  (ALDACTONE ) 25 MG tablet, Take 1 tablet (25 mg total) by mouth daily., Disp: 90 tablet, Rfl: 3   Insulin  Glargine (BASAGLAR  KWIKPEN) 100 UNIT/ML, Inject 70 Units into the skin daily., Disp: 30 mL, Rfl: 3  Allergies  Allergen Reactions   Penicillins Nausea Only    Has patient had a PCN reaction causing immediate rash, facial/tongue/throat swelling, SOB or lightheadedness with hypotension: No Has patient had a PCN reaction causing  severe rash involving mucus membranes or skin necrosis: No Has patient had a PCN reaction that required hospitalization: No Has patient had a PCN reaction occurring within the last 10 years: Yes If all of the above answers are NO, then may proceed with Cephalosporin use.    Penicillin G Rash   Penicillin G Procaine Rash    Objective:   BP 129/82   Pulse 95   Temp 98.1 F (36.7 C)   Ht 5' 10 (1.778 m)   Wt 240 lb 0.3 oz (108.9 kg)   SpO2 95%   BMI 34.44 kg/m      10/15/2023    3:33 PM 08/25/2023    1:30 PM 08/21/2023   11:01 AM  Vitals with BMI  Height 5' 10  5' 10   Weight 240 lbs 250 lbs 8 oz 250 lbs  BMI 34.44 35.94 35.87  Systolic 129 127 867  Diastolic 82 89 88  Pulse 95 90 92     Physical Exam Vitals and nursing note reviewed.  Constitutional:      Appearance: Normal appearance. He is normal weight.  HENT:     Head: Normocephalic and atraumatic.  Cardiovascular:     Rate and Rhythm: Normal rate and regular rhythm.     Pulses: Normal pulses.     Heart sounds: Normal heart sounds.  Pulmonary:     Effort: Pulmonary effort is normal.     Breath sounds: Normal breath sounds.  Skin:    General: Skin is warm and dry.     Capillary Refill: Capillary refill takes less than 2 seconds.  Neurological:     General: No focal deficit present.     Mental Status: He is alert and oriented to person, place, and time. Mental status is at baseline.  Psychiatric:        Mood and Affect: Mood normal.        Behavior: Behavior normal.        Thought Content: Thought content normal.        Judgment: Judgment normal.     Assessment & Plan:  Uncontrolled diabetes mellitus with hyperglycemia, with long-term current use of insulin  [facility]) Assessment & Plan: Placed referral to endo again as this was closed. Continue metformin  1000mg  BID. Increase Basaglar  1 unit daily until FBG <130. Follow up in 1 week.   Orders: -     Ambulatory referral to Endocrinology  Other orders -     Basaglar  KwikPen; Inject 70 Units into the skin daily.  Dispense: 30 mL; Refill: 3     Follow up plan: Return in about 1 week (around 10/22/2023) for diabetes.  Jeremiah GORMAN Barrio, FNP

## 2023-10-16 LAB — CBC WITH DIFFERENTIAL/PLATELET
Absolute Lymphocytes: 2719 {cells}/uL (ref 850–3900)
Absolute Monocytes: 748 {cells}/uL (ref 200–950)
Basophils Absolute: 53 {cells}/uL (ref 0–200)
Basophils Relative: 0.6 %
Eosinophils Absolute: 176 {cells}/uL (ref 15–500)
Eosinophils Relative: 2 %
HCT: 47.4 % (ref 38.5–50.0)
Hemoglobin: 15.3 g/dL (ref 13.2–17.1)
MCH: 28.5 pg (ref 27.0–33.0)
MCHC: 32.3 g/dL (ref 32.0–36.0)
MCV: 88.4 fL (ref 80.0–100.0)
MPV: 10.5 fL (ref 7.5–12.5)
Monocytes Relative: 8.5 %
Neutro Abs: 5104 {cells}/uL (ref 1500–7800)
Neutrophils Relative %: 58 %
Platelets: 250 Thousand/uL (ref 140–400)
RBC: 5.36 Million/uL (ref 4.20–5.80)
RDW: 13.8 % (ref 11.0–15.0)
Total Lymphocyte: 30.9 %
WBC: 8.8 Thousand/uL (ref 3.8–10.8)

## 2023-10-16 LAB — BASIC METABOLIC PANEL WITH GFR
BUN: 17 mg/dL (ref 7–25)
CO2: 31 mmol/L (ref 20–32)
Calcium: 9.3 mg/dL (ref 8.6–10.3)
Chloride: 99 mmol/L (ref 98–110)
Creat: 1.11 mg/dL (ref 0.70–1.30)
Glucose, Bld: 278 mg/dL — ABNORMAL HIGH (ref 65–99)
Potassium: 4.9 mmol/L (ref 3.5–5.3)
Sodium: 137 mmol/L (ref 135–146)
eGFR: 79 mL/min/1.73m2 (ref >=60)

## 2023-10-16 LAB — LIPID PANEL
Cholesterol: 68 mg/dL (ref <200)
HDL: 29 mg/dL — ABNORMAL LOW (ref >=40)
LDL Cholesterol (Calc): 20 mg/dL
Non-HDL Cholesterol (Calc): 39 mg/dL (ref <130)
Total CHOL/HDL Ratio: 2.3 (calc) (ref <5.0)
Triglycerides: 103 mg/dL (ref <150)

## 2023-10-16 LAB — HEMOGLOBIN A1C
Hgb A1c MFr Bld: 10.2 % — ABNORMAL HIGH (ref <5.7)
Mean Plasma Glucose: 246 mg/dL
eAG (mmol/L): 13.6 mmol/L

## 2023-10-16 LAB — TEST AUTHORIZATION

## 2023-10-25 DIAGNOSIS — Z419 Encounter for procedure for purposes other than remedying health state, unspecified: Secondary | ICD-10-CM | POA: Diagnosis not present

## 2023-10-29 ENCOUNTER — Other Ambulatory Visit (HOSPITAL_COMMUNITY): Payer: Self-pay

## 2023-10-29 NOTE — Telephone Encounter (Signed)
 Pharmacy Patient Advocate Encounter   Follow up- Did another test claim and this time it did not tell me that He had another primary insurance I needed to bill too. Attempted PA, but it came back and said it could not retrieve clinical questions due to having a previously denied PA on file. I also saw that the prescription has been discontinued from the patient's chart. If he does still need this, please let me know, so that I can follow up with the insurance and find out about this denied PA. I do not see the information about it in the patient's chart.  CMM Key that was cancelled: [facility]

## 2023-11-10 DIAGNOSIS — C44612 Basal cell carcinoma of skin of right upper limb, including shoulder: Secondary | ICD-10-CM | POA: Diagnosis not present

## 2023-11-25 ENCOUNTER — Ambulatory Visit: Attending: Internal Medicine | Admitting: Internal Medicine

## 2023-11-25 ENCOUNTER — Encounter: Payer: Self-pay | Admitting: Internal Medicine

## 2023-11-25 VITALS — BP 128/76 | HR 77 | Ht 70.0 in | Wt 245.0 lb

## 2023-11-25 DIAGNOSIS — E785 Hyperlipidemia, unspecified: Secondary | ICD-10-CM | POA: Diagnosis not present

## 2023-11-25 DIAGNOSIS — E1169 Type 2 diabetes mellitus with other specified complication: Secondary | ICD-10-CM | POA: Diagnosis not present

## 2023-11-25 DIAGNOSIS — I5022 Chronic systolic (congestive) heart failure: Secondary | ICD-10-CM | POA: Diagnosis not present

## 2023-11-25 DIAGNOSIS — I2510 Atherosclerotic heart disease of native coronary artery without angina pectoris: Secondary | ICD-10-CM | POA: Insufficient documentation

## 2023-11-25 DIAGNOSIS — I5020 Unspecified systolic (congestive) heart failure: Secondary | ICD-10-CM | POA: Diagnosis not present

## 2023-11-25 DIAGNOSIS — I252 Old myocardial infarction: Secondary | ICD-10-CM | POA: Insufficient documentation

## 2023-11-25 MED ORDER — LOSARTAN POTASSIUM 50 MG PO TABS: 50.0000 mg | ORAL_TABLET | Freq: Every day | ORAL | 3 refills | Status: AC

## 2023-11-25 NOTE — Patient Instructions (Signed)

## 2023-11-25 NOTE — Progress Notes (Signed)
 Cardiology Office Note:  .   Date:  11/25/2023  ID:  Jeremiah Neal, DOB [birth year], MRN [id number] PCP: Kayla Jeoffrey RAMAN, FNP  [facility] Providers Cardiologist:  Lonni Hanson, MD     History of Present Illness: .   Jeremiah Neal is a 53 y.o. male with history of coronary artery disease with NSTEMI status post PCI to LCx in 07/2023, heart failure with mildly reduced ejection fraction, hypertension, hyperlipidemia, type 2 diabetes mellitus, and polysubstance abuse, who presents for follow-up of coronary artery disease and heart failure.  He was last seen in our office in August by Barnie Hila, NP, following his hospital discharge in late July.  At that time, he recounted 1 episode of chest pain that resolved after sublingual nitroglycerin  x 1.  He was concerned about dyspnea with minimal exertion as well as occasional lightheadedness.  Due to intermittent dizziness, losartan  was reduced to 50 mg daily.  Jeremiah Neal reports that he has been doing well from a heart standpoint, denying chest pain, shortness of breath, palpitations, and edema.  He is tolerating his CardioMax medications well.  His biggest concern is of problems with his cervical spine, which have led to headaches and paresthesias.  He is scheduled to see a neurologist soon for further evaluation.  He reports feeling much better since stopping ezetimibe , which caused some memory and speech difficulties as well as generalized malaise.  He has not had any bleeding, remaining on aspirin  and prasugrel .  He is now interested in participating in cardiac rehab as long as his cervical spine issues and associated intermittent dizziness do not complicate this.  ROS: See HPI  Studies Reviewed: SABRA   EKG Interpretation Date/Time:  Wednesday November 25 2023 16:41:58 EST Ventricular Rate:  77 PR Interval:  180 QRS Duration:  90 QT Interval:  390 QTC Calculation: 441 R Axis:   48  Text Interpretation: Normal sinus rhythm Possible  Septal infarct (cited on or before 04-Aug-2023) Abnormal ECG When compared with ECG of 21-Aug-2023 11:15, Nonspecific T wave abnormality no longer evident in Lateral leads Confirmed by Valeri Sula, Lonni [phone]) on 11/25/2023 4:46:35 PM    Risk Assessment/Calculations:             Physical Exam:   VS:  BP 128/76 (BP Location: Left Arm, Patient Position: Sitting, Cuff Size: Large)   Pulse 77   Ht 5' 10 (1.778 m)   Wt 245 lb (111.1 kg)   SpO2 98%   BMI 35.15 kg/m    Wt Readings from Last 3 Encounters:  11/25/23 245 lb (111.1 kg)  10/15/23 240 lb 0.3 oz (108.9 kg)  08/25/23 250 lb 8 oz (113.6 kg)    General:  NAD. Neck: No JVD or HJR. Lungs: Clear to auscultation bilaterally without wheezes or crackles. Heart: Regular rate and rhythm without murmurs, rubs, or gallops. Abdomen: Soft, nontender, nondistended. Extremities: No lower extremity edema.  Right radial arteriotomy site well-healed.  ASSESSMENT AND PLAN: .    Coronary artery disease: No further angina reported since last visit.  We reviewed the importance of aggressive secondary prevention, including 12 months of dual antiplatelet therapy from the time as MI in July.  Will therefore continue aspirin  and prasugrel  as well as atorvastatin  80 mg daily.  Ezetimibe  on hold due to very low LDL and side effects; side effects have resolved with discontinuing I will reach out to our cardiac rehab team, as I think Jeremiah Neal is now appropriate for participation  Heart failure  with mildly reduced ejection fraction: LVEF noted to be mildly reduced at the time of MI in July.  [place] appears euvolemic and well compensated.  Continue current regimen of carvedilol , losartan , and spironolactone .  Most recent labs in September showed normal renal function and potassium.  Hyperlipidemia associated with type 2 diabetes mellitus: Ezetimibe  discontinued due to side effects and very low LDL on last check in September.  Will plan to continue  atorvastatin  80 mg daily for now and recheck a lipid panel at Jeremiah Neal.  He wishes to have this done through his PCPs office.  If his LDL remains well below 55, de-escalation of atorvastatin  to 40 mg daily in an attempt to minimize side effects could be considered.  Ongoing management of DM per [facility] PCP and endocrinology.    Dispo: Return to clinic in 6 months.  Signed, Lonni Hanson, MD

## 2023-11-26 ENCOUNTER — Encounter: Payer: Self-pay | Admitting: Internal Medicine

## 2023-11-26 ENCOUNTER — Other Ambulatory Visit: Payer: Self-pay | Admitting: *Deleted

## 2023-11-26 DIAGNOSIS — I5020 Unspecified systolic (congestive) heart failure: Secondary | ICD-10-CM | POA: Insufficient documentation

## 2023-12-02 ENCOUNTER — Ambulatory Visit: Payer: MEDICAID | Admitting: Neurology

## 2023-12-02 ENCOUNTER — Encounter: Payer: Self-pay | Admitting: Neurology

## 2023-12-02 VITALS — Ht 70.0 in | Wt 244.0 lb

## 2023-12-02 DIAGNOSIS — Z9189 Other specified personal risk factors, not elsewhere classified: Secondary | ICD-10-CM | POA: Diagnosis not present

## 2023-12-02 DIAGNOSIS — R202 Paresthesia of skin: Secondary | ICD-10-CM

## 2023-12-02 DIAGNOSIS — R030 Elevated blood-pressure reading, without diagnosis of hypertension: Secondary | ICD-10-CM

## 2023-12-02 DIAGNOSIS — I951 Orthostatic hypotension: Secondary | ICD-10-CM

## 2023-12-02 DIAGNOSIS — R42 Dizziness and giddiness: Secondary | ICD-10-CM | POA: Diagnosis not present

## 2023-12-02 NOTE — Patient Instructions (Addendum)
 It was nice to meet you both today.   As discussed, your symptoms of dizziness and abnormal sensations in the back of your head are likely due to a combination of factors.   Here is what we discussed today and my recommendations for you:   Unfortunately, dizziness is a very common complaint but is often not due to a primary neurological reason or single underlying medical problem. Often, there a combination of factors, that result in dizziness. This includes blood pressure fluctuations, medication side effects, blood sugar fluctuations, stress, vertigo, poor sleep with sleep deprivation, dehydration, and electrolyte disturbance or other metabolic and endocrinological reasons, meaning hormone related problems such as thyroid dysfunction.  Your blood pressure was rather high today.  Please follow-up with your cardiologist on this. We will investigate things further with a brain MRI. We will call you with the test results.  I recommend you talk to your primary care about potentially seeing an ENT specialist for vertigo symptoms. I will request a carotid Doppler ultrasound which is an ultrasound of the main arteries that supply the brain. Most likely, you will go to [facility] heart care for this.   Please limit your caffeine to 1-2 servings per day, 8 ounce size each and continue to work on smoking cessation, try to increase your water intake to about 6 to 8 cups of water per day, 8 ounce size each.  Continue to abstain from [facility] use and cocaine use completely. I recommend you proceed with a sleep study to investigate your risk for obstructive sleep apnea.  As explained, the long-term risks and ramifications of untreated moderate to severe obstructive sleep apnea may include (but are not limited to): increased risk for cardiovascular disease, including congestive heart failure, stroke, difficult to control hypertension, treatment resistant obesity, arrhythmias, especially irregular heartbeat commonly known as A.  Fib. (atrial fibrillation); even type 2 diabetes has been linked to untreated OSA.  Your abnormal sensations in the back of your head and base of the skull area are likely from pinched nerve type changes in your neck.  Follow-up with your orthopedic specialist for this.  We will keep you posted as to your test results by phone call for now and follow-up accordingly.  If you change your mind about pursuing a sleep study, please call our office or send us  a message through MyChart.

## 2023-12-02 NOTE — Progress Notes (Signed)
 Subjective:    Patient ID: Jeremiah Neal is a 53 y.o. male.  HPI    True Mar, MD, PhD [facility] [address] [address] [place], [place] [address]  Dear Dr. Laqueta,  I saw your patient, Jeremiah Neal, upon your kind request in my neurologic clinic today for evaluation of his dizziness and recurrent headaches.  The patient is accompanied by Andrea, SO, today.  As you know, Jeremiah Neal is a 52 year old male with an underlying medical history of cervical radiculopathy/neck pain, chronic systolic congestive heart failure, history of non-STEMI, hypertension, diabetes, hyperlipidemia, substance use disorder (per chart review and patient report), diverticulitis, and obesity, who reports an approximately 30-month history of abnormal sensations affecting the back of his head with sometimes a crawling sensation and intermittent cold sensation at the base of his head.  He has had the symptoms since he was diagnosed with pinched nerve in the neck.  He has undergone an EMG and nerve conduction velocity test through your office in April 2025 which showed ulnar neuropathy of the left side and carpal tunnel on the left as well as cervical radiculopathy signs with evidence of right cervical radiculopathy affecting the right C7 and C8 nerve roots.  He also had evidence of carpal tunnel on the right in the moderate range.  He is status post right carpal tunnel surgery.  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  At C5-6 he had moderate to severe biforaminal stenosis and at see 6-7 he had moderate to severe left foraminal stenosis. I reviewed your office note from 09/28/2023.  A cervical epidural steroid injection was recommended but has not been approved by his insurance yet as I  understand.  He is also on blood thinner since his cardiac stent. He is followed by cardiology.  He was seen by the cardiology nurse practitioner on 08/25/2023.  I reviewed the visit note.  He is status post stent placement in July 2025.  Sleep apnea evaluation was recommended but declined at the time. He is noted to snore but no witnessed apneas are reported by [facility]  He has never had a sleep study and declines a sleep study at this time.  He does not drink a lot of water, may be 1 cup or less on average, drinks quite a bit of tea, about 20 ounce or 16 ounce cup, 2-3 servings per day.  He drinks alcohol very occasionally.  He smokes THC occasionally.  He has stopped using cocaine less than a year ago.  He denies any sudden onset of one-sided weakness or numbness or tingling or droopy face or slurring of speech.  He can feel dizzy while sitting.  Sometimes it feels like the room is swaying or spinning even.  He has not seen ENT yet.  His Past Medical History Is Significant For: Past Medical History:  Diagnosis Date   Diabetes mellitus without complication (HCC)    type 2   Diverticulitis    Hyperlipidemia    Hypertension     His Past Surgical History Is Significant For: Past Surgical History:  Procedure Laterality Date   CORONARY STENT INTERVENTION N/A 08/03/2023   Procedure: CORONARY STENT INTERVENTION;  Surgeon: Mady Bruckner, MD;  Location: [facility] INVASIVE CV LAB;  Service: Cardiovascular;  Laterality: N/A;   LEFT HEART  CATH AND CORONARY ANGIOGRAPHY N/A 08/03/2023   Procedure: LEFT HEART CATH AND CORONARY ANGIOGRAPHY;  Surgeon: Mady Bruckner, MD;  Location: [facility] INVASIVE CV LAB;  Service: Cardiovascular;  Laterality: N/A;    His Family History Is Significant For: Family History  Problem Relation Age of Onset   Heart attack Father     His Social History Is Significant For: Social History   Socioeconomic History   Marital status: Single    Spouse name: Not on file   Number of  children: Not on file   Years of education: Not on file   Highest education level: 12th grade  Occupational History   Not on file  Tobacco Use   Smoking status: Some Days    Current packs/day: 0.25    Average packs/day: 0.3 packs/day for 0.9 years (0.2 ttl pk-yrs)    Types: Cigarettes    Start date: 2025   Smokeless tobacco: Never  Vaping Use   Vaping status: Never Used  Substance and Sexual Activity   Alcohol use: Yes    Comment: Fifth of liquor per week, usually on Fridays   Drug use: Yes    Types: Marijuana   Sexual activity: Yes  Other Topics Concern   Not on file  Social History Narrative   ** Merged History Encounter **       Pt lives with family    Social Drivers of Health   Financial Resource Strain: Patient Declined (08/09/2023)   Overall Financial Resource Strain (CARDIA)    Difficulty of Paying Living Expenses: Patient declined  Recent Concern: Financial Resource Strain - High Risk (08/06/2023)   Overall Financial Resource Strain (CARDIA)    Difficulty of Paying Living Expenses: Very hard  Food Insecurity: Patient Declined (08/09/2023)   Hunger Vital Sign    Worried About Running Out of Food in the Last Year: Patient declined    Ran Out of Food in the Last Year: Patient declined  Recent Concern: Food Insecurity - Food Insecurity Present (08/06/2023)   Hunger Vital Sign    Worried About Running Out of Food in the Last Year: Sometimes true    Ran Out of Food in the Last Year: Sometimes true  Transportation Needs: Unmet Transportation Needs (08/09/2023)   PRAPARE - Transportation    Lack of Transportation (Medical): Yes    Lack of Transportation (Non-Medical): Patient declined  Physical Activity: Inactive (08/09/2023)   Exercise Vital Sign    Days of Exercise per Week: 0 days    Minutes of Exercise per Session: Not on file  Stress: No Stress Concern Present (08/09/2023)   [employer] of Occupational Health - Occupational Stress Questionnaire    Feeling of  Stress: Only a little  Social Connections: Moderately Isolated (08/09/2023)   Social Connection and Isolation Panel    Frequency of Communication with Friends and Family: Twice a week    Frequency of Social Gatherings with Friends and Family: Once a week    Attends Religious Services: Never    Database Administrator or Organizations: No    Attends Engineer, Structural: Not on file    Marital Status: Living with partner    His Allergies Are:  Allergies  Allergen Reactions   Penicillins Nausea Only    Has patient had a PCN reaction causing immediate rash, facial/tongue/throat swelling, SOB or lightheadedness with hypotension: No Has patient had a PCN reaction causing severe rash involving mucus membranes or skin necrosis: No Has patient had a PCN reaction that required  hospitalization: No Has patient had a PCN reaction occurring within the last 10 years: Yes If all of the above answers are NO, then may proceed with Cephalosporin use.    Penicillin G Rash   Penicillin G Procaine Rash  :   His Current Medications Are:  Outpatient Encounter Medications as of 12/02/2023  Medication Sig   acetaminophen  (TYLENOL ) 500 MG tablet Take 500 mg by mouth every 6 (six) hours as needed for mild pain. (Patient taking differently: Take 500 mg by mouth as needed for mild pain (pain score 1-3).)   aspirin  EC 81 MG tablet Take 1 tablet (81 mg total) by mouth daily. Swallow whole.   atorvastatin  (LIPITOR ) 80 MG tablet Take 1 tablet (80 mg total) by mouth daily.   BD PEN NEEDLE SHORT ULTRAFINE 31G X 8 MM MISC USE AS DIRECTED   carvedilol  (COREG ) 3.125 MG tablet Take 1 tablet (3.125 mg total) by mouth 2 (two) times daily with a meal.   gabapentin  (NEURONTIN ) 100 MG capsule Take 1-2 capsules (100-200 mg total) by mouth 3 (three) times daily.   Insulin  Glargine (BASAGLAR  KWIKPEN) 100 UNIT/ML Inject 70 Units into the skin daily.   losartan  (COZAAR ) 50 MG tablet Take 1 tablet (50 mg total) by mouth  daily.   metFORMIN  (GLUCOPHAGE ) 1000 MG tablet TAKE 1 TABLET (1,000 MG TOTAL) BY MOUTH TWICE A DAY WITH FOOD   nitroGLYCERIN  (NITROSTAT ) 0.4 MG SL tablet Place 1 tablet (0.4 mg total) under the tongue every 5 (five) minutes x 3 doses as needed for chest pain.   prasugrel  (EFFIENT ) 10 MG TABS tablet Take 1 tablet (10 mg total) by mouth daily.   spironolactone  (ALDACTONE ) 25 MG tablet Take 1 tablet (25 mg total) by mouth daily.   No facility-administered encounter medications on file as of 12/02/2023.  :   Review of Systems:  Out of a complete 14 point review of systems, all are reviewed and negative with the exception of these symptoms as listed below:   Review of Systems  Objective:  Neurological Exam  Physical Exam Physical Examination:    On orthostatic testing he has mild lightheadedness and spinning sensation, dizziness.  He has a 30 point drop in the systolic blood pressure between lying and standing. His blood pressure is elevated today.  He denies any symptoms from it and reports that it was normal recently at the cardiology clinic.  General Examination: The patient is a very pleasant 53 y.o. male in no acute distress. He appears well-developed and well-nourished and well groomed.   HEENT: Normocephalic, atraumatic, pupils are equal, round and reactive to light, extraocular tracking is good without limitation to gaze excursion or nystagmus noted. No photophobia.  No Corrective eye glasses in place. Hearing is grossly intact.  Face is symmetric with normal facial animation and normal facial sensation to light touch, temperature and vibration sense. Speech is clear without dysarthria. There is no hypophonia. There is no lip, neck/head, jaw or voice tremor. Neck is supple with full range of passive and active motion. There are no carotid bruits on auscultation.  Airway/Oropharynx exam reveals: mild to moderate mouth dryness, adequate dental hygiene, moderate to marked airway  crowding secondary to small airway entry, thicker soft palate, larger uvula, tonsils in place.  Mallampati class IV.  Tongue protrudes centrally and palate elevates symmetrically.   Chest: Clear to auscultation without wheezing, rhonchi or crackles noted.  Heart: S1+S2+0, regular and normal without murmurs, rubs or gallops noted.   Abdomen: Soft, non-tender  and non-distended.  Extremities: There is no pitting edema in the distal lower extremities bilaterally.   Skin: Warm and dry without trophic changes noted.   Musculoskeletal: exam reveals no obvious joint deformities.   Neurologically:  Mental status: The patient is awake, alert and oriented in all 4 spheres. His immediate and remote memory, attention, language skills and fund of knowledge are appropriate. There is no evidence of aphasia, agnosia, apraxia or anomia. Speech is clear with normal prosody and enunciation. Thought process is linear. Mood is normal and affect is normal.  Cranial nerves II - XII are as described above under HEENT exam.  Motor exam: Normal bulk, strength and tone is noted. There is no obvious action or resting tremor.  No drift or rebound, no postural or intention tremor. Fine motor skills and coordination: Intact finger taps, hand movements and rapid alternating patting with both upper extremities, normal foot taps bilaterally in the lower extremities.  Cerebellar testing: No dysmetria or intention tremor. There is no truncal or gait ataxia.  Normal finger-to-nose, normal heel-to-shin bilaterally. Reflexes 1-2+ throughout, toes are downgoing bilaterally. Romberg negative. Sensory exam: intact to light touch, temperature and vibration sense in the upper and lower extremities.  Gait, station and balance: He stands easily. No veering to one side is noted. No leaning to one side is noted. Posture is age-appropriate and stance is narrow based. Gait shows normal stride length and normal pace. No problems turning are  noted.  Normal tandem walk.  Assessment and Plan:  In summary, DEMETRES PROCHNOW is a very pleasant 53 year old male with an underlying medical history of cervical radiculopathy/neck pain, chronic systolic congestive heart failure, history of non-STEMI, hypertension, diabetes, hyperlipidemia, substance use disorder (per chart review and patient report), diverticulitis, and obesity, who presents for evaluation of his recurrent dizziness as well as abnormal sensations in the back of his head and base of the skull area.  Neurological exam is nonfocal with the exception that he had some dizziness upon standing with no obvious vertiginous symptoms today, blood pressure was elevated and he had orthostatic systolic blood pressure drop in the significant arena.  I had a long discussion with the patient and Andrea today, we talked about causes of dizziness, vertigo, recurrent paresthesias and the possibility of sleep disordered breathing in his case as well.  He does appear to be at risk for sleep apnea based on his symptoms and his examination but declines sleep testing today.  We will proceed with further investigation with a brain MRI with and without contrast as well as a carotid Doppler ultrasound.  This was an extended visit of over 60 minutes with copious record review involved including paper records as well as electronic chart review, addressing multiple issues, and considerable counseling and coordination of care after a comprehensive exam.   Below is a summary of my recommendations and our discussion points from today's visit, based on chart review, history and examination. They were given these instructions verbally during the visit in detail and also in writing in the MyChart after visit summary (AVS), which they can access electronically. <<  Unfortunately, dizziness is a very common complaint but is often not due to a primary neurological reason or single underlying medical problem. Often, there a  combination of factors, that result in dizziness. This includes blood pressure fluctuations, medication side effects, blood sugar fluctuations, stress, vertigo, poor sleep with sleep deprivation, dehydration, and electrolyte disturbance or other metabolic and endocrinological reasons, meaning hormone related problems such as  thyroid dysfunction.  Your blood pressure was rather high today.  Please follow-up with your cardiologist on this. We will investigate things further with a brain MRI. We will call you with the test results.  I recommend you talk to your primary care about potentially seeing an ENT specialist for vertigo symptoms. I will request a carotid Doppler ultrasound which is an ultrasound of the main arteries that supply the brain. Most likely, you will go to [facility] heart care for this.   Please limit your caffeine to 1-2 servings per day, 8 ounce size each and continue to work on smoking cessation, try to increase your water intake to about 6 to 8 cups of water per day, 8 ounce size each.  Continue to abstain from [facility] use and cocaine use completely. I recommend you proceed with a sleep study to investigate your risk for obstructive sleep apnea.  As explained, the long-term risks and ramifications of untreated moderate to severe obstructive sleep apnea may include (but are not limited to): increased risk for cardiovascular disease, including congestive heart failure, stroke, difficult to control hypertension, treatment resistant obesity, arrhythmias, especially irregular heartbeat commonly known as A. Fib. (atrial fibrillation); even type 2 diabetes has been linked to untreated OSA.  Your abnormal sensations in the back of your head and base of the skull area are likely from pinched nerve type changes in your neck.  Follow-up with your orthopedic specialist for this.  We will keep you posted as to your test results by phone call for now and follow-up accordingly.  If you change your mind about  pursuing a sleep study, please call our office or send us  a message through MyChart. >>    Thank you very much for allowing me to participate in the care of this nice patient. If I can be of any further assistance to you please do not hesitate to call me at [phone].  Sincerely,   True Mar, MD, PhD

## 2023-12-08 ENCOUNTER — Ambulatory Visit (HOSPITAL_COMMUNITY)

## 2023-12-14 ENCOUNTER — Ambulatory Visit (HOSPITAL_COMMUNITY)

## 2023-12-18 ENCOUNTER — Other Ambulatory Visit

## 2023-12-21 ENCOUNTER — Ambulatory Visit (HOSPITAL_COMMUNITY): Admission: RE | Admit: 2023-12-21 | Discharge: 2023-12-21 | Attending: Neurology | Admitting: Neurology

## 2023-12-21 DIAGNOSIS — R202 Paresthesia of skin: Secondary | ICD-10-CM | POA: Diagnosis not present

## 2023-12-21 DIAGNOSIS — R42 Dizziness and giddiness: Secondary | ICD-10-CM

## 2023-12-21 DIAGNOSIS — Z9189 Other specified personal risk factors, not elsewhere classified: Secondary | ICD-10-CM

## 2023-12-21 DIAGNOSIS — I951 Orthostatic hypotension: Secondary | ICD-10-CM

## 2023-12-21 DIAGNOSIS — R030 Elevated blood-pressure reading, without diagnosis of hypertension: Secondary | ICD-10-CM

## 2023-12-22 ENCOUNTER — Ambulatory Visit: Payer: Self-pay | Admitting: Neurology

## 2023-12-23 ENCOUNTER — Telehealth: Payer: Self-pay | Admitting: Neurology

## 2023-12-23 NOTE — Telephone Encounter (Addendum)
 Spoke to patient aware MRI declined by insurance  Pt deferred sleep study Pt thanked me for calling

## 2023-12-23 NOTE — Telephone Encounter (Signed)
 Noted, thank you!

## 2023-12-23 NOTE — Telephone Encounter (Signed)
 LVM for patient to call back and discuss MRI being declined

## 2023-12-23 NOTE — Telephone Encounter (Signed)
 After submitting everything we have to his insurance they denied the MRI asking for the following information:  notes from your doctor saying that you have other problems along with your dizziness (like vision changes, hearing loss, trouble walking, or weakness). We need to know if you have problems that puts you at a high risk of stroke. Otherwise, we need complete nerve exam findings and results of some other testing that show why this test is needed.  I did also send them [facility]'s notes from September.

## 2023-12-23 NOTE — Telephone Encounter (Signed)
 Please adv pt that his brain MRI request has been repeatedly denied by his insurance despite submitting additional information.  He can follow-up with his PCP and other providers for now.  I had offered sleep testing for investigation of his recurrent headaches which he had declined at the time, I would be happy to order a sleep study if he agrees to proceed.

## 2023-12-25 DIAGNOSIS — Z419 Encounter for procedure for purposes other than remedying health state, unspecified: Secondary | ICD-10-CM | POA: Diagnosis not present

## 2023-12-29 DIAGNOSIS — M19031 Primary osteoarthritis, right wrist: Secondary | ICD-10-CM | POA: Diagnosis not present

## 2023-12-29 DIAGNOSIS — M19032 Primary osteoarthritis, left wrist: Secondary | ICD-10-CM | POA: Diagnosis not present

## 2023-12-30 ENCOUNTER — Encounter: Payer: Self-pay | Admitting: Family Medicine

## 2024-01-04 ENCOUNTER — Other Ambulatory Visit: Payer: Self-pay | Admitting: Family Medicine

## 2024-01-04 MED ORDER — BASAGLAR KWIKPEN 100 UNIT/ML ~~LOC~~ SOPN: 70.0000 [IU] | PEN_INJECTOR | Freq: Every day | SUBCUTANEOUS | 3 refills | Status: AC

## 2024-01-12 DIAGNOSIS — M542 Cervicalgia: Secondary | ICD-10-CM | POA: Diagnosis not present

## 2024-01-12 DIAGNOSIS — R42 Dizziness and giddiness: Secondary | ICD-10-CM | POA: Diagnosis not present

## 2024-01-12 DIAGNOSIS — M5412 Radiculopathy, cervical region: Secondary | ICD-10-CM | POA: Diagnosis not present

## 2024-01-12 DIAGNOSIS — M7918 Myalgia, other site: Secondary | ICD-10-CM | POA: Diagnosis not present

## 2024-01-19 ENCOUNTER — Telehealth: Payer: Self-pay | Admitting: Internal Medicine

## 2024-01-19 NOTE — Telephone Encounter (Signed)
"  ° °  Patient Name: Jeremiah Neal  DOB: November 08, 1970 MRN: [id number]  Primary Cardiologist: Lonni Hanson, MD  Chart reviewed as part of pre-operative protocol coverage.  Patient seen in clinic by Dr. Hanson on 11/25/2023.  Patient with history of NSTEMI in 07/2023 s/p PCI to LCx with recommendation to continue 12 months of dual antiplatelet therapy.  Reviewed with Dr. Hanson,  Unless he is having intractable symptoms that require urgent injection, I recommend completing 12 months of DAPT before antiplatelet therapy is held.  Per Dr. Ulysses recommendations patient should remain on dual antiplatelet therapy at this time and should not be interrupted.  Thank you, Kenji Mapel D Aydrian Halpin, NP 01/19/2024, 3:57 PM   "

## 2024-01-19 NOTE — Telephone Encounter (Signed)
 I will reach out to [facility] in regard to no preop clearance was entered in the chart.

## 2024-01-19 NOTE — Telephone Encounter (Signed)
"  ° °  Pre-operative Risk Assessment    Patient Name: Jeremiah Neal  DOB: 08-04-1970 MRN: [id number]   Date of last office visit: 11/25/23 Date of next office visit: TBD   Request for Surgical Clearance    Procedure:  C7-T1 interlaminar epidural steroid injection  Date of Surgery:  Clearance TBD                                Surgeon:  Dr. Laqueta Socks Group or Practice Name:  [facility] Phone number:  [phone]  Fax number:  [phone]   Type of Clearance Requested:   - Pharmacy:  Hold Aspirin  & prasugrel    Type of Anesthesia:  Not Indicated   Additional requests/questions:    Bonney Audrene LOISE Agapito   01/19/2024, 1:21 PM   "

## 2024-01-19 NOTE — Telephone Encounter (Signed)
 Unless he is having intractable symptoms that require urgent injection, I recommend completing 12 months of DAPT before antiplatelet therapy is held.  Lonni Hanson, MD [facility]

## 2024-01-25 ENCOUNTER — Telehealth: Payer: Self-pay | Admitting: Family

## 2024-01-25 NOTE — Progress Notes (Unsigned)
 "  [facility] Note   Referring Physician: admission 07/25 PCP: Kayla Jeoffrey RAMAN, FNP Cardiologist: Lonni Hanson, MD   Chief Complaint: shortness of breath   HPI:  Jeremiah Neal is a 54 y/o male with a history of HTN, HLD, NSTEMI (07/25), DM, substance use and HFrEF.   Admitted 08/03/23 with chest pain left-sided radiating to the jaw and becoming diaphoretic on and off since 3 o'clock this morning which woke him up. Cardiology consultation done.  Initially started on IV heparin  drip and nitroglycerin  drip. He underwent emergent cardiac cath 08/03/23 and had stent placement in the mid left circumflex. Echo 08/03/23: EF 45-50%, severe LVH, moderate hypokinesis, normal RV, mild Jeremiah. 1 dose of IV lasix  given and then placed on oral diuretics.   Seen in [facility] 07/25 where furosemide  was increased to 40mg  daily. 20meq potassium was started after lab results obtained.   He presents today for a HF follow-up visit with a chief complaint of shortness of breath. Has associated fatigue, palpitations. Dizziness in morning but has improved since medications adjusted. Not sleeping very well which he says is his biggest concern. + snoring. Says that he sometimes wakes up feeling rested and other times, he feels just as tired as when he went to sleep. Denies chest pain, pedal edema.   Smoking on occasion, rare alcohol use, marijuana sometimes. Denies cocaine  ROS: All systems negative except what is listed in HPI, PMH and Problem List  Past Medical History:  Diagnosis Date   Diabetes mellitus without complication (HCC)    type 2   Diverticulitis    Hyperlipidemia    Hypertension     Current Outpatient Medications  Medication Sig Dispense Refill   acetaminophen  (TYLENOL ) 500 MG tablet Take 500 mg by mouth every 6 (six) hours as needed for mild pain. (Patient taking differently: Take 500 mg by mouth as needed for mild pain (pain score 1-3).)     aspirin  EC 81 MG tablet Take 1 tablet (81 mg  total) by mouth daily. Swallow whole. 30 tablet 12   atorvastatin  (LIPITOR ) 80 MG tablet Take 1 tablet (80 mg total) by mouth daily. 90 tablet 3   BD PEN NEEDLE SHORT ULTRAFINE 31G X 8 MM MISC USE AS DIRECTED 100 each 1   carvedilol  (COREG ) 3.125 MG tablet Take 1 tablet (3.125 mg total) by mouth 2 (two) times daily with a meal. 180 tablet 3   gabapentin  (NEURONTIN ) 100 MG capsule Take 1-2 capsules (100-200 mg total) by mouth 3 (three) times daily. 180 capsule 2   Insulin  Glargine (BASAGLAR  KWIKPEN) 100 UNIT/ML Inject 70 Units into the skin daily. 45 mL 3   losartan  (COZAAR ) 50 MG tablet Take 1 tablet (50 mg total) by mouth daily. 90 tablet 3   metFORMIN  (GLUCOPHAGE ) 1000 MG tablet TAKE 1 TABLET (1,000 MG TOTAL) BY MOUTH TWICE A DAY WITH FOOD 180 tablet 1   nitroGLYCERIN  (NITROSTAT ) 0.4 MG SL tablet Place 1 tablet (0.4 mg total) under the tongue every 5 (five) minutes x 3 doses as needed for chest pain. 25 tablet 3   prasugrel  (EFFIENT ) 10 MG TABS tablet Take 1 tablet (10 mg total) by mouth daily. 90 tablet 3   spironolactone  (ALDACTONE ) 25 MG tablet Take 1 tablet (25 mg total) by mouth daily. 90 tablet 3   No current facility-administered medications for this visit.    Allergies  Allergen Reactions   Penicillins Nausea Only    Has patient had a PCN reaction causing  immediate rash, facial/tongue/throat swelling, SOB or lightheadedness with hypotension: No Has patient had a PCN reaction causing severe rash involving mucus membranes or skin necrosis: No Has patient had a PCN reaction that required hospitalization: No Has patient had a PCN reaction occurring within the last 10 years: Yes If all of the above answers are NO, then may proceed with Cephalosporin use.    Penicillin G Rash   Penicillin G Procaine Rash      Social History   Socioeconomic History   Marital status: Single    Spouse name: Not on file   Number of children: Not on file   Years of education: Not on file    Highest education level: 12th grade  Occupational History   Not on file  Tobacco Use   Smoking status: Some Days    Current packs/day: 0.25    Average packs/day: 0.3 packs/day for 1 year (0.3 ttl pk-yrs)    Types: Cigarettes    Start date: 2025   Smokeless tobacco: Never  Vaping Use   Vaping status: Never Used  Substance and Sexual Activity   Alcohol use: Yes    Comment: Fifth of liquor per week, usually on Fridays   Drug use: Yes    Types: Marijuana   Sexual activity: Yes  Other Topics Concern   Not on file  Social History Narrative   ** Merged History Encounter **       Pt lives with family    Social Drivers of Health   Tobacco Use: High Risk (12/02/2023)   Patient History    Smoking Tobacco Use: Some Days    Smokeless Tobacco Use: Never    Passive Exposure: Not on file  Financial Resource Strain: Patient Declined (08/09/2023)   Overall Financial Resource Strain (CARDIA)    Difficulty of Paying Living Expenses: Patient declined  Recent Concern: Physicist, Medical Strain - High Risk (08/06/2023)   Overall Financial Resource Strain (CARDIA)    Difficulty of Paying Living Expenses: Very hard  Food Insecurity: Patient Declined (08/09/2023)   Epic    Worried About Programme Researcher, Broadcasting/film/video in the Last Year: Patient declined    Barista in the Last Year: Patient declined  Recent Concern: Food Insecurity - Food Insecurity Present (08/06/2023)   Epic    Worried About Programme Researcher, Broadcasting/film/video in the Last Year: Sometimes true    Ran Out of Food in the Last Year: Sometimes true  Transportation Needs: Unmet Transportation Needs (08/09/2023)   Epic    Lack of Transportation (Medical): Yes    Lack of Transportation (Non-Medical): Patient declined  Physical Activity: Inactive (08/09/2023)   Exercise Vital Sign    Days of Exercise per Week: 0 days    Minutes of Exercise per Session: Not on file  Stress: No Stress Concern Present (08/09/2023)   [employer] of Occupational Health -  Occupational Stress Questionnaire    Feeling of Stress: Only a little  Social Connections: Moderately Isolated (08/09/2023)   Social Connection and Isolation Panel    Frequency of Communication with Friends and Family: Twice a week    Frequency of Social Gatherings with Friends and Family: Once a week    Attends Religious Services: Never    Database Administrator or Organizations: No    Attends Banker Meetings: Not on file    Marital Status: Living with partner  Intimate Partner Violence: Patient Declined (08/03/2023)   Epic    Fear of Current or  Ex-Partner: Patient declined    Emotionally Abused: Patient declined    Physically Abused: Patient declined    Sexually Abused: Patient declined  Depression (PHQ2-9): Medium Risk (04/06/2023)   Depression (PHQ2-9)    PHQ-2 Score: 7  Alcohol Screen: Low Risk (05/05/2023)   Alcohol Screen    Last Alcohol Screening Score (AUDIT): 4  Housing: Unknown (08/09/2023)   Epic    Unable to Pay for Housing in the Last Year: Patient declined    Number of Times Moved in the Last Year: 0    Homeless in the Last Year: No  Recent Concern: Housing - High Risk (08/06/2023)   Epic    Unable to Pay for Housing in the Last Year: Yes    Number of Times Moved in the Last Year: Not on file    Homeless in the Last Year: No  Utilities: Patient Declined (08/03/2023)   Epic    Threatened with loss of utilities: Patient declined  Health Literacy: Not on file      Family History  Problem Relation Age of Onset   Heart attack Father    There were no vitals filed for this visit.  Wt Readings from Last 3 Encounters:  12/02/23 244 lb (110.7 kg)  11/25/23 245 lb (111.1 kg)  10/15/23 240 lb 0.3 oz (108.9 kg)   Lab Results  Component Value Date   CREATININE 1.11 10/09/2023   CREATININE 1.16 08/12/2023   CREATININE 1.15 08/06/2023    PHYSICAL EXAM:  General: Well appearing.  Cor: No JVD. Regular rhythm, rate.  Lungs: clear Abdomen: soft,  nontender, nondistended. Extremities: no edema Neuro:. Affect pleasant   ECG: not done   ASSESSMENT & PLAN:  1: ICM with reduced ejection fraction- - cath 08/03/23: subtotally occluded mid LCx successfully treated with 2 overlapping DES  - NYHA class II - weight up 4 pounds from last visit here 3 weeks ago. He says that when he has trouble sleeping, he will eat.  - Echo 08/03/23: EF 45-50%, severe LVH, moderate hypokinesis, normal RV, mild Jeremiah - continue carvedilol  3.125mg  BID - change furosemide  to 20mg  PRN for above weight gain or worsening symptoms - continue losartan  50mg  daily. Was experiencing dizziness with the 100mg  dose - no longer taking potassium tablets - continue spironolactone  25mg  daily - Discussed potential benefits of SGLT2 and he declines starting it at this time. Says that he really doesn't want to take any more medications - reviewed keeping daily fluid intake to 60-64 ounces  - Discussed doing a sleep study due to snoring, fatigue and he declines that at this time as well.   2: NSTEMI- - cath 08/03/23: subtotally occluded mid LCx successfully treated with 2 overlapping DES  - saw cardiology ([place]) 08/25 - continue aspirin  and prasugrel  for at least 12 months (07/26) - Cardiac rehab referral was already placed during recent admission.   3: HTN- - BP 118/73 - saw PCP El[place]) 07/25 - BMET 08/12/23 reviewed: sodium 138, potassium 4.9, creatinine 1.16 and GFR 75  4: DM (managed by PCP)- - A1c 07/13/23 was 9.9% - continue metformin , insulin   5: Hyperlipidemia- - LDL 08/03/23 was 73 - lipo (a) 08/04/23 was 103.5 - continue atorvastatin  80mg  daily - continue zetia  10mg  daily  6: Substance use- - occasional tobacco - marijuana use sometimes   Return in 5 months, sooner if needed.   Ellouise DELENA Class, FNP 01/25/2024  "

## 2024-01-25 NOTE — Telephone Encounter (Signed)
 Called to confirm/remind patient of their appointment at the [facility] on 01/26/24.   Appointment:   [x] Confirmed  [] Left mess   [] No answer/No voice mail  [] VM Full/unable to leave message  [] Phone not in service  Patient reminded to bring all medications and/or complete list.  Confirmed patient has transportation. Gave directions, instructed to utilize valet parking.

## 2024-01-26 ENCOUNTER — Encounter: Payer: Self-pay | Admitting: Family

## 2024-01-26 ENCOUNTER — Ambulatory Visit: Attending: Family | Admitting: Family

## 2024-01-26 VITALS — BP 146/88 | HR 90 | Wt 254.2 lb

## 2024-01-26 DIAGNOSIS — F17210 Nicotine dependence, cigarettes, uncomplicated: Secondary | ICD-10-CM | POA: Insufficient documentation

## 2024-01-26 DIAGNOSIS — Z79899 Other long term (current) drug therapy: Secondary | ICD-10-CM | POA: Diagnosis not present

## 2024-01-26 DIAGNOSIS — I5020 Unspecified systolic (congestive) heart failure: Secondary | ICD-10-CM | POA: Diagnosis not present

## 2024-01-26 DIAGNOSIS — E785 Hyperlipidemia, unspecified: Secondary | ICD-10-CM | POA: Diagnosis not present

## 2024-01-26 DIAGNOSIS — Z7982 Long term (current) use of aspirin: Secondary | ICD-10-CM | POA: Insufficient documentation

## 2024-01-26 DIAGNOSIS — I110 Hypertensive heart disease with heart failure: Secondary | ICD-10-CM | POA: Diagnosis not present

## 2024-01-26 DIAGNOSIS — I10 Essential (primary) hypertension: Secondary | ICD-10-CM

## 2024-01-26 DIAGNOSIS — I5022 Chronic systolic (congestive) heart failure: Secondary | ICD-10-CM | POA: Diagnosis not present

## 2024-01-26 DIAGNOSIS — F1290 Cannabis use, unspecified, uncomplicated: Secondary | ICD-10-CM | POA: Diagnosis not present

## 2024-01-26 DIAGNOSIS — E119 Type 2 diabetes mellitus without complications: Secondary | ICD-10-CM | POA: Diagnosis not present

## 2024-01-26 DIAGNOSIS — I255 Ischemic cardiomyopathy: Secondary | ICD-10-CM | POA: Diagnosis not present

## 2024-01-26 DIAGNOSIS — Z794 Long term (current) use of insulin: Secondary | ICD-10-CM | POA: Insufficient documentation

## 2024-01-26 DIAGNOSIS — Z7984 Long term (current) use of oral hypoglycemic drugs: Secondary | ICD-10-CM | POA: Diagnosis not present

## 2024-01-26 DIAGNOSIS — F1910 Other psychoactive substance abuse, uncomplicated: Secondary | ICD-10-CM

## 2024-01-26 DIAGNOSIS — Z955 Presence of coronary angioplasty implant and graft: Secondary | ICD-10-CM | POA: Diagnosis not present

## 2024-01-26 DIAGNOSIS — E782 Mixed hyperlipidemia: Secondary | ICD-10-CM

## 2024-01-26 DIAGNOSIS — I252 Old myocardial infarction: Secondary | ICD-10-CM | POA: Diagnosis not present

## 2024-01-26 DIAGNOSIS — R5383 Other fatigue: Secondary | ICD-10-CM | POA: Diagnosis present

## 2024-01-26 MED ORDER — CARVEDILOL 6.25 MG PO TABS: 6.2500 mg | ORAL_TABLET | Freq: Two times a day (BID) | ORAL | 3 refills | Status: AC

## 2024-01-26 NOTE — Patient Instructions (Signed)
 Medication Changes:  INCREASE Carvedilol  to 6.25mg  (1 tab) two times daily   Follow-Up in: Please follow up with the [facility] in 3 months with Dr. Zenaida. If you do not hear from us  by March 2026, please give us  a call in order to schedule your follow up appointment.    Thank you for choosing [facility].    At the [facility], you and your health needs are our priority. We have a designated team specialized in the treatment of Heart Failure. This Care Team includes your primary Heart Failure Specialized Cardiologist (physician), Advanced Practice Providers (APPs- Physician Assistants and Nurse Practitioners), and Pharmacist who all work together to provide you with the care you need, when you need it.   You may see any of the following providers on your designated Care Team at your next follow up:  Dr. Toribio Fuel Dr. Ezra Shuck Dr. Ria Commander Dr. Morene Zenaida Ellouise Class, FNP Jaun Bash, RPH-CPP  Please be sure to bring in all your medications bottles to every appointment.   Need to Contact Us :  If you have any questions or concerns before your next appointment please send us  a message through [facility] or call our office at [phone].    TO LEAVE A MESSAGE FOR THE NURSE SELECT OPTION 2, PLEASE LEAVE A MESSAGE INCLUDING: YOUR NAME DATE OF BIRTH CALL BACK NUMBER REASON FOR CALL**this is important as we prioritize the call backs  YOU WILL RECEIVE A CALL BACK THE SAME DAY AS LONG AS YOU CALL BEFORE 4:00 PM

## 2024-02-11 ENCOUNTER — Ambulatory Visit: Admitting: Nurse Practitioner

## 2024-02-11 ENCOUNTER — Encounter: Payer: Self-pay | Admitting: Nurse Practitioner

## 2024-02-11 VITALS — BP 124/88 | HR 92 | Ht 70.0 in | Wt 252.2 lb

## 2024-02-11 DIAGNOSIS — I10 Essential (primary) hypertension: Secondary | ICD-10-CM | POA: Diagnosis not present

## 2024-02-11 DIAGNOSIS — Z7984 Long term (current) use of oral hypoglycemic drugs: Secondary | ICD-10-CM | POA: Diagnosis not present

## 2024-02-11 DIAGNOSIS — Z794 Long term (current) use of insulin: Secondary | ICD-10-CM

## 2024-02-11 DIAGNOSIS — E1165 Type 2 diabetes mellitus with hyperglycemia: Secondary | ICD-10-CM

## 2024-02-11 DIAGNOSIS — E782 Mixed hyperlipidemia: Secondary | ICD-10-CM | POA: Diagnosis not present

## 2024-02-11 LAB — POCT GLYCOSYLATED HEMOGLOBIN (HGB A1C): Hemoglobin A1C: 8.7 % — AB (ref 4.0–5.6)

## 2024-02-11 MED ORDER — DEXCOM G7 SENSOR MISC: 1.0000 | 3 refills | Status: AC

## 2024-02-11 NOTE — Progress Notes (Signed)
 "                                                                        Endocrinology Consult Note       02/11/2024, 3:06 PM   Subjective:    Patient ID: Jeremiah Neal, male    DOB: September 15, 1970.  Jeremiah Neal is being seen in consultation for management of currently uncontrolled symptomatic diabetes requested by  Kayla Jeoffrey RAMAN, FNP.   Past Medical History:  Diagnosis Date   Diabetes mellitus without complication (HCC)    type 2   Diverticulitis    Hyperlipidemia    Hypertension     Past Surgical History:  Procedure Laterality Date   CORONARY STENT INTERVENTION N/A 08/03/2023   Procedure: CORONARY STENT INTERVENTION;  Surgeon: Mady Bruckner, MD;  Location: [facility];  Service: Cardiovascular;  Laterality: N/A;   LEFT HEART CATH AND CORONARY ANGIOGRAPHY N/A 08/03/2023   Procedure: LEFT HEART CATH AND CORONARY ANGIOGRAPHY;  Surgeon: Mady Bruckner, MD;  Location: [facility];  Service: Cardiovascular;  Laterality: N/A;    Social History   Socioeconomic History   Marital status: Single    Spouse name: Not on file   Number of children: Not on file   Years of education: Not on file   Highest education level: 12th grade  Occupational History   Not on file  Tobacco Use   Smoking status: Some Days    Current packs/day: 0.25    Average packs/day: 0.3 packs/day for 1.1 years (0.3 ttl pk-yrs)    Types: Cigarettes    Start date: 2025   Smokeless tobacco: Never  Vaping Use   Vaping status: Never Used  Substance and Sexual Activity   Alcohol use: Yes    Comment: Fifth of liquor per week, usually on Fridays   Drug use: Yes    Types: Marijuana   Sexual activity: Yes  Other Topics Concern   Not on file  Social History Narrative   ** Merged History Encounter **       Pt lives with family    Social Drivers of Health   Tobacco Use: High Risk (02/11/2024)   Patient History    Smoking Tobacco Use: Some Days    Smokeless Tobacco Use: Never    Passive  Exposure: Not on file  Financial Resource Strain: Patient Declined (08/09/2023)   Overall Financial Resource Strain (CARDIA)    Difficulty of Paying Living Expenses: Patient declined  Recent Concern: Physicist, Medical Strain - High Risk (08/06/2023)   Overall Financial Resource Strain (CARDIA)    Difficulty of Paying Living Expenses: Very hard  Food Insecurity: Patient Declined (08/09/2023)   Epic    Worried About Programme Researcher, Broadcasting/film/video in the Last Year: Patient declined    Barista in the Last Year: Patient declined  Recent Concern: Food Insecurity - Food Insecurity Present (08/06/2023)   Epic    Worried About Programme Researcher, Broadcasting/film/video in the Last Year: Sometimes true    Ran Out of Food in the Last Year: Sometimes true  Transportation Needs: Unmet Transportation Needs (08/09/2023)   Epic    Lack of Transportation (Medical): Yes    Lack of Transportation (  Non-Medical): Patient declined  Physical Activity: Inactive (08/09/2023)   Exercise Vital Sign    Days of Exercise per Week: 0 days    Minutes of Exercise per Session: Not on file  Stress: No Stress Concern Present (08/09/2023)   [employer] of Occupational Health - Occupational Stress Questionnaire    Feeling of Stress: Only a little  Social Connections: Moderately Isolated (08/09/2023)   Social Connection and Isolation Panel    Frequency of Communication with Friends and Family: Twice a week    Frequency of Social Gatherings with Friends and Family: Once a week    Attends Religious Services: Never    Database Administrator or Organizations: No    Attends Engineer, Structural: Not on file    Marital Status: Living with partner  Depression (PHQ2-9): Medium Risk (04/06/2023)   Depression (PHQ2-9)    PHQ-2 Score: 7  Alcohol Screen: Low Risk (05/05/2023)   Alcohol Screen    Last Alcohol Screening Score (AUDIT): 4  Housing: Unknown (08/09/2023)   Epic    Unable to Pay for Housing in the Last Year: Patient declined    Number  of Times Moved in the Last Year: 0    Homeless in the Last Year: No  Recent Concern: Housing - High Risk (08/06/2023)   Epic    Unable to Pay for Housing in the Last Year: Yes    Number of Times Moved in the Last Year: Not on file    Homeless in the Last Year: No  Utilities: Patient Declined (08/03/2023)   Epic    Threatened with loss of utilities: Patient declined  Health Literacy: Not on file    Family History  Problem Relation Age of Onset   Heart attack Father     Outpatient Encounter Medications as of 02/11/2024  Medication Sig   acetaminophen  (TYLENOL ) 500 MG tablet Take 500 mg by mouth every 6 (six) hours as needed for mild pain. (Patient taking differently: Take 500 mg by mouth as needed for mild pain (pain score 1-3).)   aspirin  EC 81 MG tablet Take 1 tablet (81 mg total) by mouth daily. Swallow whole.   atorvastatin  (LIPITOR ) 80 MG tablet Take 1 tablet (80 mg total) by mouth daily.   BD PEN NEEDLE SHORT ULTRAFINE 31G X 8 MM MISC USE AS DIRECTED   carvedilol  (COREG ) 6.25 MG tablet Take 1 tablet (6.25 mg total) by mouth 2 (two) times daily with a meal.   Continuous Glucose Sensor (DEXCOM G7 SENSOR) MISC Inject 1 Application into the skin as directed. Change sensor every 10 days as directed.   gabapentin  (NEURONTIN ) 300 MG capsule Take 300 mg by mouth daily as needed.   Insulin  Glargine (BASAGLAR  KWIKPEN) 100 UNIT/ML Inject 70 Units into the skin daily. (Patient taking differently: Inject 80 Units into the skin at bedtime.)   losartan  (COZAAR ) 50 MG tablet Take 1 tablet (50 mg total) by mouth daily.   metFORMIN  (GLUCOPHAGE ) 1000 MG tablet TAKE 1 TABLET (1,000 MG TOTAL) BY MOUTH TWICE A DAY WITH FOOD   nitroGLYCERIN  (NITROSTAT ) 0.4 MG SL tablet Place 1 tablet (0.4 mg total) under the tongue every 5 (five) minutes x 3 doses as needed for chest pain.   prasugrel  (EFFIENT ) 10 MG TABS tablet Take 1 tablet (10 mg total) by mouth daily.   spironolactone  (ALDACTONE ) 25 MG tablet Take 1  tablet (25 mg total) by mouth daily.   No facility-administered encounter medications on file as of 02/11/2024.  ALLERGIES: Allergies[1]  VACCINATION STATUS: Immunization History  Administered Date(s) Administered   Tdap 08/24/2017    Diabetes He presents for his initial diabetic visit. He has type 2 diabetes mellitus. Onset time: diagnosed at approx age of 60. His disease course has been fluctuating. There are no hypoglycemic associated symptoms. Pertinent negatives for diabetes include no fatigue, no polydipsia, no polyuria and no weight loss. There are no hypoglycemic complications. Diabetic complications include heart disease (CAD with MI). Risk factors for coronary artery disease include diabetes mellitus, dyslipidemia, family history, male sex, hypertension, obesity and sedentary lifestyle. Current diabetic treatment includes oral agent (monotherapy) and insulin  injections. He is compliant with treatment most of the time. His weight is fluctuating minimally. He is following a generally unhealthy diet. When asked about meal planning, he reported none. He has not had a previous visit with a dietitian. He participates in exercise intermittently. (He presents today for his consultation with no meter or logs to review.  He does not monitor glucose at home currently.  His POCT A1c today is 8.7%, improving from last A1c in September of 10.2%.  He drinks sweet tea, occasionally coffee with SF sweetener, and rarely a soda.  He eats 2-3 meals per day and snacks.  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  He is overdue for eye exam, has never seen podiatry in the past.) An ACE inhibitor/angiotensin II receptor blocker is being taken. He does not see a podiatrist.Eye exam is not current.     Review of systems  Constitutional: + Minimally fluctuating body weight, current Body mass index is 36.19 kg/m., no fatigue, no subjective hyperthermia, no  subjective hypothermia Eyes: no blurry vision, no xerophthalmia ENT: no sore throat, no nodules palpated in throat, no dysphagia/odynophagia, no hoarseness Cardiovascular: no chest pain, no shortness of breath, no palpitations, no leg swelling Respiratory: no cough, no shortness of breath Gastrointestinal: no nausea/vomiting/diarrhea Musculoskeletal: no muscle/joint aches Skin: no rashes, no hyperemia Neurological: no tremors, no numbness, no tingling, no dizziness Psychiatric: no depression, no anxiety  Objective:     BP 124/88 (BP Location: Left Arm, Patient Position: Sitting, Cuff Size: Large)   Pulse 92   Ht 5' 10 (1.778 m)   Wt 252 lb 3.2 oz (114.4 kg)   BMI 36.19 kg/m   Wt Readings from Last 3 Encounters:  02/11/24 252 lb 3.2 oz (114.4 kg)  01/26/24 254 lb 3.2 oz (115.3 kg)  12/02/23 244 lb (110.7 kg)     BP Readings from Last 3 Encounters:  02/11/24 124/88  01/26/24 (!) 146/88  11/25/23 128/76     Physical Exam- Limited  Constitutional:  Body mass index is 36.19 kg/m. , not in acute distress, normal state of mind Eyes:  EOMI, no exophthalmos Neck: Supple Cardiovascular: RRR, no murmurs, rubs, or gallops, no edema Respiratory: Adequate breathing efforts, no crackles, rales, rhonchi, or wheezing Musculoskeletal: no gross deformities, strength intact in all four extremities, no gross restriction of joint movements Skin:  no rashes, no hyperemia Neurological: no tremor with outstretched hands   Diabetic Foot Exam - Simple   No data filed      CMP ( most recent) CMP     Component Value Date/Time   NA 137 10/09/2023 0947   K 4.9 10/09/2023 0947   CL 99 10/09/2023 0947   CO2 31 10/09/2023 0947   GLUCOSE 278 (H) 10/09/2023 0947   BUN 17 10/09/2023 0947   CREATININE 1.11  10/09/2023 0947   CALCIUM  9.3 10/09/2023 0947   PROT 6.9 08/12/2023 1538   ALBUMIN 3.7 08/03/2023 0516   AST 19 08/12/2023 1538   ALT 29 08/12/2023 1538   ALKPHOS 81 08/03/2023 0516    BILITOT 0.4 08/12/2023 1538   EGFR 79 10/09/2023 0947   GFRNONAA >60 08/06/2023 1453     Diabetic Labs (most recent): Lab Results  Component Value Date   HGBA1C 8.7 (A) 02/11/2024   HGBA1C 10.2 (H) 10/09/2023   HGBA1C 9.9 (H) 07/13/2023   MICROALBUR 2.5 04/06/2023     Lipid Panel ( most recent) Lipid Panel     Component Value Date/Time   CHOL 68 10/09/2023 0947   TRIG 103 10/09/2023 0947   HDL 29 (L) 10/09/2023 0947   CHOLHDL 2.3 10/09/2023 0947   VLDL 29 08/03/2023 0900   LDLCALC 20 10/09/2023 0947      No results found for: TSH, FREET4         Assessment & Plan:   1) Type 2 diabetes mellitus with hyperglycemia, with long-term current use of insulin  (HCC) (Primary)  He presents today for his consultation with no meter or logs to review.  He does not monitor glucose at home currently.  His POCT A1c today is 8.7%, improving from last A1c in September of 10.2%.  He drinks sweet tea, occasionally coffee with SF sweetener, and rarely a soda.  He eats 2-3 meals per day and snacks.  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  He is overdue for eye exam, has never seen podiatry in the past.  - SHOTA KOHRS has currently uncontrolled symptomatic type 2 DM since 54 years of age, with most recent A1c of 8.7 %.   -Recent labs reviewed.  - I had a long discussion with him about the progressive nature of diabetes and the pathology behind its complications. -his diabetes is complicated by CAD with MI and he remains at a high risk for more acute and chronic complications which include CAD, CVA, CKD, retinopathy, and neuropathy. These are all discussed in detail with him.  The following Lifestyle Medicine recommendations according to American College of Lifestyle Medicine [facility]) were discussed and offered to patient and he agrees to start the journey:  A. Whole Foods, Plant-based plate comprising of fruits and vegetables,  plant-based proteins, whole-grain carbohydrates was discussed in detail with the patient.   A list for source of those nutrients were also provided to the patient.  Patient will use only water or unsweetened tea for hydration. B.  The need to stay away from risky substances including alcohol, smoking; obtaining 7 to 9 hours of restorative sleep, at least 150 minutes of moderate intensity exercise weekly, the importance of healthy social connections,  and stress reduction techniques were discussed. C.  A full color page of  Calorie density of various food groups per pound showing examples of each food groups was provided to the patient.  - I have counseled him on diet and weight management by adopting a carbohydrate restricted/protein rich diet. Patient is encouraged to switch to unprocessed or minimally processed complex starch and increased protein intake (animal or plant source), fruits, and vegetables. -  he is advised to stick to a routine mealtimes to eat 3 meals a day and avoid unnecessary snacks (to snack only to correct hypoglycemia).   - he acknowledges that there is a room for improvement in his food and drink choices. -  Suggestion is made for him to avoid simple carbohydrates from his diet including Cakes, Sweet Desserts, Ice Cream, Soda (diet and regular), Sweet Tea, Candies, Chips, Cookies, Store Bought Juices, Alcohol in Excess of 1-2 drinks a day, Artificial Sweeteners, Coffee Creamer, and Sugar-free Products. This will help patient to have more stable blood glucose profile and potentially avoid unintended weight gain.  - I have approached him with the following individualized plan to manage his diabetes and patient agrees:   -I encouraged him to lower his Basaglar  to 60 units SQ nightly.  -he is encouraged to start/continue monitoring glucose 2 times daily, before breakfast and before bed, and to call the clinic if he has readings less than 70 or above 300 for 3 tests in a row.  I  sent in for Dexcom G7 for him and gave him sample of sensor and receiver to get started.  - he is warned not to take insulin  without proper monitoring per orders. - Adjustment parameters are given to him for hypo and hyperglycemia in writing.  - he is advised to continue Metformin  1000 mg po twice daily after meals, therapeutically suitable for patient .  - he will be considered for incretin therapy as appropriate next visit.  He did try Trulicity in the past but stopped due to GI concerns.  He does smoke which increases his risk of pancreatitis with this class of meds.  - Specific targets for  A1c; LDL, HDL, and Triglycerides were discussed with the patient.  2) Blood Pressure /Hypertension:  his blood pressure is controlled to target.   he is advised to continue his current medications as prescribed by PCP.  3) Lipids/Hyperlipidemia:    Review of his recent lipid panel from 10/09/23 showed controlled LDL at 20 .  he is advised to continue Lipitor  80 mg daily at bedtime.  Side effects and precautions discussed with him.  4)  Weight/Diet:  his Body mass index is 36.19 kg/m.  -  clearly complicating his diabetes care.   he is a candidate for weight loss. I discussed with him the fact that loss of 5 - 10% of his  current body weight will have the most impact on his diabetes management.  Exercise, and detailed carbohydrates information provided  -  detailed on discharge instructions.  5) Chronic Care/Health Maintenance: -he is on ACEI/ARB and Statin medications and is encouraged to initiate and continue to follow up with Ophthalmology, Dentist, Podiatrist at least yearly or according to recommendations, and advised to QUIT SMOKING. I have recommended yearly flu vaccine and pneumonia vaccine at least every 5 years; moderate intensity exercise for up to 150 minutes weekly; and sleep for at least 7 hours a day.  - he is advised to maintain close follow up with Kayla Jeoffrey RAMAN, FNP for primary care  needs, as well as his other providers for optimal and coordinated care.   - Time spent in this patient care: 60 min, which was spent in counseling him about his diabetes and the rest reviewing his blood glucose logs, discussing his hypoglycemia and hyperglycemia episodes, reviewing his current and previous labs/studies (including abstraction from other facilities) and medications doses and developing a long term treatment plan based on the latest standards of care/guidelines; and documenting his care.    Please refer to Patient Instructions for Blood Glucose Monitoring and Insulin /Medications Dosing Guide in media tab for additional information. Please also refer to Patient Self Inventory in the Media tab for reviewed elements of pertinent patient  history.  Jeremiah Neal participated in the discussions, expressed understanding, and voiced agreement with the above plans.  All questions were answered to his satisfaction. he is encouraged to contact clinic should he have any questions or concerns prior to his return visit.     Follow up plan: - Return in about 3 months (around 05/11/2024) for Diabetes F/U with A1c in office, No previsit labs, Bring meter and logs.    Benton Rio, FNP-BC [facility] [address] [place], [place] [address] Phone: [phone] Fax: [phone]  02/11/2024, 3:06 PM       [1]  Allergies Allergen Reactions   Penicillins Nausea Only    Has patient had a PCN reaction causing immediate rash, facial/tongue/throat swelling, SOB or lightheadedness with hypotension: No Has patient had a PCN reaction causing severe rash involving mucus membranes or skin necrosis: No Has patient had a PCN reaction that required hospitalization: No Has patient had a PCN reaction occurring within the last 10 years: Yes If all of the above answers are NO, then may proceed with Cephalosporin use.    Penicillin G Rash   Penicillin G Procaine Rash    "

## 2024-02-11 NOTE — Telephone Encounter (Signed)
 Patient needs PA for Dexcom G7.

## 2024-02-15 ENCOUNTER — Encounter: Payer: Self-pay | Admitting: Nurse Practitioner

## 2024-02-16 ENCOUNTER — Telehealth: Payer: Self-pay

## 2024-02-16 ENCOUNTER — Other Ambulatory Visit (HOSPITAL_COMMUNITY): Payer: Self-pay

## 2024-02-16 MED ORDER — ACCU-CHEK GUIDE TEST VI STRP: ORAL_STRIP | 12 refills | Status: AC

## 2024-02-16 NOTE — Telephone Encounter (Signed)
 Pharmacy Patient Advocate Encounter   Received notification from Pt Calls Messages that prior authorization for Dexcom G7 sensor is required/requested.   Insurance verification completed.   The patient is insured through [facility].   Per test claim: PA required; PA submitted to above mentioned insurance via Latent Key/confirmation #/EOC [id number] Status is pending

## 2024-02-16 NOTE — Telephone Encounter (Signed)
 Pharmacy Patient Advocate Encounter  Received notification from [facility] that Prior Authorization for Dexcom G7 sensor has been APPROVED from 02/16/2024 to 02/15/2025   PA #/Case ID/Reference #: [id number]

## 2024-02-17 NOTE — Telephone Encounter (Signed)
 Patient was called and a message was left sharing this , Dexcom approved.

## 2024-05-25 ENCOUNTER — Ambulatory Visit: Admitting: Nurse Practitioner
